# Patient Record
Sex: Female | Born: 1950 | Race: White | Hispanic: No | Marital: Single | Smoking: Never smoker
Health system: Southern US, Community
[De-identification: ages and names within clinical notes are randomized; demographics above are authoritative.]

## PROBLEM LIST (undated history)

## (undated) DIAGNOSIS — Z9221 Personal history of antineoplastic chemotherapy: Secondary | ICD-10-CM

## (undated) DIAGNOSIS — F329 Major depressive disorder, single episode, unspecified: Secondary | ICD-10-CM

## (undated) DIAGNOSIS — I10 Essential (primary) hypertension: Secondary | ICD-10-CM

## (undated) DIAGNOSIS — F32A Depression, unspecified: Secondary | ICD-10-CM

## (undated) DIAGNOSIS — Z860100 Personal history of colon polyps, unspecified: Secondary | ICD-10-CM

## (undated) DIAGNOSIS — Z923 Personal history of irradiation: Secondary | ICD-10-CM

## (undated) DIAGNOSIS — H269 Unspecified cataract: Secondary | ICD-10-CM

## (undated) DIAGNOSIS — C50919 Malignant neoplasm of unspecified site of unspecified female breast: Secondary | ICD-10-CM

## (undated) DIAGNOSIS — E039 Hypothyroidism, unspecified: Secondary | ICD-10-CM

## (undated) DIAGNOSIS — M1990 Unspecified osteoarthritis, unspecified site: Secondary | ICD-10-CM

## (undated) DIAGNOSIS — E119 Type 2 diabetes mellitus without complications: Secondary | ICD-10-CM

## (undated) DIAGNOSIS — N938 Other specified abnormal uterine and vaginal bleeding: Secondary | ICD-10-CM

## (undated) HISTORY — DX: Unspecified cataract: H26.9

## (undated) HISTORY — PX: SKIN BIOPSY: SHX1

## (undated) HISTORY — DX: Major depressive disorder, single episode, unspecified: F32.9

## (undated) HISTORY — DX: Malignant neoplasm of unspecified site of unspecified female breast: C50.919

## (undated) HISTORY — DX: Type 2 diabetes mellitus without complications: E11.9

## (undated) HISTORY — PX: ENDOMETRIAL BIOPSY: SHX622

## (undated) HISTORY — DX: Depression, unspecified: F32.A

## (undated) HISTORY — PX: COLONOSCOPY: SHX174

## (undated) HISTORY — DX: Hypothyroidism, unspecified: E03.9

## (undated) HISTORY — DX: Unspecified osteoarthritis, unspecified site: M19.90

---

## 2004-03-21 ENCOUNTER — Ambulatory Visit: Payer: Self-pay | Admitting: Family Medicine

## 2004-07-05 ENCOUNTER — Ambulatory Visit: Payer: Self-pay | Admitting: Family Medicine

## 2004-07-20 ENCOUNTER — Ambulatory Visit: Payer: Self-pay | Admitting: Family Medicine

## 2005-06-26 ENCOUNTER — Ambulatory Visit: Payer: Self-pay | Admitting: Family Medicine

## 2007-02-19 ENCOUNTER — Ambulatory Visit: Payer: Self-pay | Admitting: General Practice

## 2007-06-30 ENCOUNTER — Ambulatory Visit: Payer: Self-pay | Admitting: Family Medicine

## 2007-07-09 DIAGNOSIS — E039 Hypothyroidism, unspecified: Secondary | ICD-10-CM

## 2007-07-09 HISTORY — DX: Hypothyroidism, unspecified: E03.9

## 2007-08-26 ENCOUNTER — Ambulatory Visit: Payer: Self-pay | Admitting: Unknown Physician Specialty

## 2008-07-07 ENCOUNTER — Ambulatory Visit: Payer: Self-pay | Admitting: Family Medicine

## 2010-01-09 ENCOUNTER — Ambulatory Visit: Payer: Self-pay | Admitting: Family Medicine

## 2011-01-14 ENCOUNTER — Ambulatory Visit: Payer: Self-pay | Admitting: Family Medicine

## 2012-03-04 ENCOUNTER — Ambulatory Visit: Payer: Self-pay | Admitting: Family Medicine

## 2013-01-14 DIAGNOSIS — Z923 Personal history of irradiation: Secondary | ICD-10-CM

## 2013-01-14 DIAGNOSIS — C50919 Malignant neoplasm of unspecified site of unspecified female breast: Secondary | ICD-10-CM

## 2013-01-14 DIAGNOSIS — Z9221 Personal history of antineoplastic chemotherapy: Secondary | ICD-10-CM

## 2013-01-14 HISTORY — DX: Malignant neoplasm of unspecified site of unspecified female breast: C50.919

## 2013-01-14 HISTORY — DX: Personal history of irradiation: Z92.3

## 2013-01-14 HISTORY — DX: Personal history of antineoplastic chemotherapy: Z92.21

## 2013-01-26 ENCOUNTER — Ambulatory Visit: Payer: Self-pay | Admitting: Ophthalmology

## 2013-01-26 LAB — POTASSIUM: POTASSIUM: 3.7 mmol/L (ref 3.5–5.1)

## 2013-02-08 ENCOUNTER — Ambulatory Visit: Payer: Self-pay | Admitting: Ophthalmology

## 2013-02-08 HISTORY — PX: CATARACT EXTRACTION: SUR2

## 2013-03-14 HISTORY — PX: BREAST BIOPSY: SHX20

## 2013-04-07 ENCOUNTER — Ambulatory Visit: Payer: Self-pay | Admitting: Family Medicine

## 2013-04-13 ENCOUNTER — Ambulatory Visit: Payer: Self-pay | Admitting: Family Medicine

## 2013-04-19 ENCOUNTER — Ambulatory Visit: Payer: Self-pay | Admitting: Oncology

## 2013-04-21 LAB — CBC CANCER CENTER
Basophil #: 0.1 x10 3/mm (ref 0.0–0.1)
Basophil %: 1.1 %
EOS ABS: 0.1 x10 3/mm (ref 0.0–0.7)
Eosinophil %: 1.6 %
HCT: 38.9 % (ref 35.0–47.0)
HGB: 12.5 g/dL (ref 12.0–16.0)
Lymphocyte #: 1.7 x10 3/mm (ref 1.0–3.6)
Lymphocyte %: 18.8 %
MCH: 26.9 pg (ref 26.0–34.0)
MCHC: 32.2 g/dL (ref 32.0–36.0)
MCV: 84 fL (ref 80–100)
MONO ABS: 0.4 x10 3/mm (ref 0.2–0.9)
MONOS PCT: 4.7 %
NEUTROS ABS: 6.7 x10 3/mm — AB (ref 1.4–6.5)
NEUTROS PCT: 73.8 %
PLATELETS: 295 x10 3/mm (ref 150–440)
RBC: 4.65 10*6/uL (ref 3.80–5.20)
RDW: 14.8 % — ABNORMAL HIGH (ref 11.5–14.5)
WBC: 9 x10 3/mm (ref 3.6–11.0)

## 2013-04-21 LAB — COMPREHENSIVE METABOLIC PANEL WITH GFR
Albumin: 3.7 g/dL
Alkaline Phosphatase: 98 U/L
Anion Gap: 7
BUN: 17 mg/dL
Bilirubin,Total: 0.3 mg/dL
Calcium, Total: 8.9 mg/dL
Chloride: 101 mmol/L
Co2: 29 mmol/L
Creatinine: 1 mg/dL
EGFR (African American): >60
EGFR (Non-African Amer.): >60
Glucose: 266 mg/dL — ABNORMAL HIGH
Osmolality: 285
Potassium: 4.1 mmol/L
SGOT(AST): 20 U/L
SGPT (ALT): 30 U/L
Sodium: 137 mmol/L
Total Protein: 7.8 g/dL

## 2013-04-22 LAB — PATHOLOGY REPORT

## 2013-05-04 ENCOUNTER — Ambulatory Visit: Payer: Self-pay | Admitting: Surgery

## 2013-05-07 ENCOUNTER — Ambulatory Visit: Payer: Self-pay | Admitting: Oncology

## 2013-05-07 ENCOUNTER — Ambulatory Visit: Payer: Self-pay | Admitting: Surgery

## 2013-05-10 LAB — PATHOLOGY REPORT

## 2013-05-14 ENCOUNTER — Ambulatory Visit: Payer: Self-pay | Admitting: Oncology

## 2013-05-17 LAB — CBC CANCER CENTER
Basophil #: 0.2 x10 3/mm — ABNORMAL HIGH (ref 0.0–0.1)
Basophil %: 0.6 %
EOS ABS: 0.1 x10 3/mm (ref 0.0–0.7)
EOS PCT: 0.4 %
HCT: 37.3 % (ref 35.0–47.0)
HGB: 12.4 g/dL (ref 12.0–16.0)
LYMPHS PCT: 6.7 %
Lymphocyte #: 1.7 x10 3/mm (ref 1.0–3.6)
MCH: 27.3 pg (ref 26.0–34.0)
MCHC: 33.3 g/dL (ref 32.0–36.0)
MCV: 82 fL (ref 80–100)
Monocyte #: 1.1 x10 3/mm — ABNORMAL HIGH (ref 0.2–0.9)
Monocyte %: 4.2 %
NEUTROS ABS: 22.3 x10 3/mm — AB (ref 1.4–6.5)
Neutrophil %: 88.1 %
PLATELETS: 255 x10 3/mm (ref 150–440)
RBC: 4.54 10*6/uL (ref 3.80–5.20)
RDW: 14.3 % (ref 11.5–14.5)
WBC: 25.4 x10 3/mm — AB (ref 3.6–11.0)

## 2013-05-24 LAB — CBC CANCER CENTER
BASOS ABS: 0.1 x10 3/mm (ref 0.0–0.1)
BASOS PCT: 0.8 %
EOS ABS: 0 x10 3/mm (ref 0.0–0.7)
Eosinophil %: 0 %
HCT: 34.7 % — ABNORMAL LOW (ref 35.0–47.0)
HGB: 11.4 g/dL — AB (ref 12.0–16.0)
LYMPHS PCT: 10.9 %
Lymphocyte #: 1.9 x10 3/mm (ref 1.0–3.6)
MCH: 29.1 pg (ref 26.0–34.0)
MCHC: 32.9 g/dL (ref 32.0–36.0)
MCV: 88 fL (ref 80–100)
MONOS PCT: 4.4 %
Monocyte #: 0.8 x10 3/mm (ref 0.2–0.9)
NEUTROS PCT: 83.9 %
Neutrophil #: 14.3 x10 3/mm — ABNORMAL HIGH (ref 1.4–6.5)
PLATELETS: 214 x10 3/mm (ref 150–440)
RBC: 3.92 10*6/uL (ref 3.80–5.20)
RDW: 14.3 % (ref 11.5–14.5)
WBC: 17.1 x10 3/mm — ABNORMAL HIGH (ref 3.6–11.0)

## 2013-05-31 LAB — COMPREHENSIVE METABOLIC PANEL
ALT: 30 U/L (ref 12–78)
ANION GAP: 12 (ref 7–16)
Albumin: 3.3 g/dL — ABNORMAL LOW (ref 3.4–5.0)
Alkaline Phosphatase: 99 U/L
BUN: 13 mg/dL (ref 7–18)
Bilirubin,Total: 0.3 mg/dL (ref 0.2–1.0)
CO2: 30 mmol/L (ref 21–32)
CREATININE: 0.99 mg/dL (ref 0.60–1.30)
Calcium, Total: 8.6 mg/dL (ref 8.5–10.1)
Chloride: 99 mmol/L (ref 98–107)
Glucose: 167 mg/dL — ABNORMAL HIGH (ref 65–99)
Osmolality: 285 (ref 275–301)
Potassium: 3.6 mmol/L (ref 3.5–5.1)
SGOT(AST): 15 U/L (ref 15–37)
Sodium: 141 mmol/L (ref 136–145)
Total Protein: 7.1 g/dL (ref 6.4–8.2)

## 2013-05-31 LAB — CBC CANCER CENTER
Basophil #: 0.1 x10 3/mm (ref 0.0–0.1)
Basophil %: 1.1 %
EOS ABS: 0.1 x10 3/mm (ref 0.0–0.7)
Eosinophil %: 0.5 %
HCT: 31.4 % — ABNORMAL LOW (ref 35.0–47.0)
HGB: 10.7 g/dL — AB (ref 12.0–16.0)
LYMPHS ABS: 1.5 x10 3/mm (ref 1.0–3.6)
Lymphocyte %: 12.8 %
MCH: 30.9 pg (ref 26.0–34.0)
MCHC: 34.3 g/dL (ref 32.0–36.0)
MCV: 90 fL (ref 80–100)
Monocyte #: 0.7 x10 3/mm (ref 0.2–0.9)
Monocyte %: 6.3 %
Neutrophil #: 9.1 x10 3/mm — ABNORMAL HIGH (ref 1.4–6.5)
Neutrophil %: 79.3 %
PLATELETS: 482 x10 3/mm — AB (ref 150–440)
RBC: 3.48 10*6/uL — AB (ref 3.80–5.20)
RDW: 14.4 % (ref 11.5–14.5)
WBC: 11.5 x10 3/mm — AB (ref 3.6–11.0)

## 2013-06-08 LAB — CBC CANCER CENTER
Basophil #: 0.1 x10 3/mm (ref 0.0–0.1)
Basophil %: 0.5 %
EOS ABS: 0.2 x10 3/mm (ref 0.0–0.7)
Eosinophil %: 1 %
HCT: 33.6 % — ABNORMAL LOW (ref 35.0–47.0)
HGB: 10.9 g/dL — ABNORMAL LOW (ref 12.0–16.0)
Lymphocyte #: 1.8 x10 3/mm (ref 1.0–3.6)
Lymphocyte %: 9.1 %
MCH: 27.6 pg (ref 26.0–34.0)
MCHC: 32.4 g/dL (ref 32.0–36.0)
MCV: 86 fL (ref 80–100)
MONO ABS: 1 x10 3/mm — AB (ref 0.2–0.9)
MONOS PCT: 4.9 %
NEUTROS PCT: 84.5 %
Neutrophil #: 17 x10 3/mm — ABNORMAL HIGH (ref 1.4–6.5)
Platelet: 215 x10 3/mm (ref 150–440)
RBC: 3.93 10*6/uL (ref 3.80–5.20)
RDW: 15 % — ABNORMAL HIGH (ref 11.5–14.5)
WBC: 20.1 x10 3/mm — ABNORMAL HIGH (ref 3.6–11.0)

## 2013-06-14 ENCOUNTER — Ambulatory Visit: Payer: Self-pay | Admitting: Oncology

## 2013-06-14 LAB — CBC CANCER CENTER
BASOS PCT: 0.4 %
Basophil #: 0.1 x10 3/mm (ref 0.0–0.1)
EOS ABS: 0 x10 3/mm (ref 0.0–0.7)
Eosinophil %: 0.1 %
HCT: 34 % — AB (ref 35.0–47.0)
HGB: 11.1 g/dL — ABNORMAL LOW (ref 12.0–16.0)
LYMPHS ABS: 1.5 x10 3/mm (ref 1.0–3.6)
Lymphocyte %: 9.2 %
MCH: 28.4 pg (ref 26.0–34.0)
MCHC: 32.7 g/dL (ref 32.0–36.0)
MCV: 87 fL (ref 80–100)
Monocyte #: 0.6 x10 3/mm (ref 0.2–0.9)
Monocyte %: 3.5 %
NEUTROS ABS: 14.3 x10 3/mm — AB (ref 1.4–6.5)
Neutrophil %: 86.8 %
Platelet: 208 x10 3/mm (ref 150–440)
RBC: 3.93 10*6/uL (ref 3.80–5.20)
RDW: 15.5 % — ABNORMAL HIGH (ref 11.5–14.5)
WBC: 16.5 x10 3/mm — ABNORMAL HIGH (ref 3.6–11.0)

## 2013-06-21 LAB — CBC CANCER CENTER
BASOS ABS: 0.1 x10 3/mm (ref 0.0–0.1)
Basophil %: 0.8 %
EOS PCT: 0.4 %
Eosinophil #: 0.1 x10 3/mm (ref 0.0–0.7)
HCT: 32 % — AB (ref 35.0–47.0)
HGB: 10.5 g/dL — ABNORMAL LOW (ref 12.0–16.0)
Lymphocyte #: 2 x10 3/mm (ref 1.0–3.6)
Lymphocyte %: 16.1 %
MCH: 28.5 pg (ref 26.0–34.0)
MCHC: 32.9 g/dL (ref 32.0–36.0)
MCV: 87 fL (ref 80–100)
MONO ABS: 0.7 x10 3/mm (ref 0.2–0.9)
Monocyte %: 5.9 %
Neutrophil #: 9.5 x10 3/mm — ABNORMAL HIGH (ref 1.4–6.5)
Neutrophil %: 76.8 %
PLATELETS: 457 x10 3/mm — AB (ref 150–440)
RBC: 3.69 10*6/uL — ABNORMAL LOW (ref 3.80–5.20)
RDW: 17.4 % — AB (ref 11.5–14.5)
WBC: 12.3 x10 3/mm — ABNORMAL HIGH (ref 3.6–11.0)

## 2013-06-21 LAB — COMPREHENSIVE METABOLIC PANEL
AST: 18 U/L (ref 15–37)
Albumin: 3.4 g/dL (ref 3.4–5.0)
Alkaline Phosphatase: 111 U/L
Anion Gap: 10 (ref 7–16)
BILIRUBIN TOTAL: 0.3 mg/dL (ref 0.2–1.0)
BUN: 16 mg/dL (ref 7–18)
CALCIUM: 8.8 mg/dL (ref 8.5–10.1)
Chloride: 98 mmol/L (ref 98–107)
Co2: 29 mmol/L (ref 21–32)
Creatinine: 1.06 mg/dL (ref 0.60–1.30)
EGFR (African American): >60
GFR CALC NON AF AMER: 56 — AB
Glucose: 216 mg/dL — ABNORMAL HIGH (ref 65–99)
OSMOLALITY: 282 (ref 275–301)
Potassium: 3.2 mmol/L — ABNORMAL LOW (ref 3.5–5.1)
SGPT (ALT): 29 U/L (ref 12–78)
Sodium: 137 mmol/L (ref 136–145)
Total Protein: 7.3 g/dL (ref 6.4–8.2)

## 2013-06-28 LAB — CBC CANCER CENTER
BASOS ABS: 0.1 x10 3/mm (ref 0.0–0.1)
Basophil %: 1 %
Eosinophil #: 0 x10 3/mm (ref 0.0–0.7)
Eosinophil %: 0.3 %
HCT: 32.8 % — ABNORMAL LOW (ref 35.0–47.0)
HGB: 11 g/dL — AB (ref 12.0–16.0)
Lymphocyte #: 1.6 x10 3/mm (ref 1.0–3.6)
Lymphocyte %: 12.9 %
MCH: 29.1 pg (ref 26.0–34.0)
MCHC: 33.5 g/dL (ref 32.0–36.0)
MCV: 87 fL (ref 80–100)
MONOS PCT: 9.3 %
Monocyte #: 1.2 x10 3/mm — ABNORMAL HIGH (ref 0.2–0.9)
NEUTROS ABS: 9.6 x10 3/mm — AB (ref 1.4–6.5)
Neutrophil %: 76.5 %
Platelet: 312 x10 3/mm (ref 150–440)
RBC: 3.78 10*6/uL — AB (ref 3.80–5.20)
RDW: 16.9 % — ABNORMAL HIGH (ref 11.5–14.5)
WBC: 12.6 x10 3/mm — ABNORMAL HIGH (ref 3.6–11.0)

## 2013-07-05 LAB — CBC CANCER CENTER
BASOS PCT: 0.5 %
Basophil #: 0.1 x10 3/mm (ref 0.0–0.1)
Eosinophil #: 0 x10 3/mm (ref 0.0–0.7)
Eosinophil %: 0.1 %
HCT: 31.1 % — AB (ref 35.0–47.0)
HGB: 10.7 g/dL — AB (ref 12.0–16.0)
LYMPHS PCT: 9.7 %
Lymphocyte #: 1.8 x10 3/mm (ref 1.0–3.6)
MCH: 36.5 pg — AB (ref 26.0–34.0)
MCHC: 34.5 g/dL (ref 32.0–36.0)
MCV: 106 fL — AB (ref 80–100)
MONO ABS: 0.8 x10 3/mm (ref 0.2–0.9)
MONOS PCT: 4.3 %
NEUTROS ABS: 15.8 x10 3/mm — AB (ref 1.4–6.5)
Neutrophil %: 85.4 %
Platelet: 197 x10 3/mm (ref 150–440)
RBC: 2.94 10*6/uL — AB (ref 3.80–5.20)
RDW: 16.8 % — ABNORMAL HIGH (ref 11.5–14.5)
WBC: 18.5 x10 3/mm — AB (ref 3.6–11.0)

## 2013-07-12 LAB — COMPREHENSIVE METABOLIC PANEL
ALBUMIN: 3.3 g/dL — AB (ref 3.4–5.0)
ALK PHOS: 108 U/L
Anion Gap: 4 — ABNORMAL LOW (ref 7–16)
BILIRUBIN TOTAL: 0.3 mg/dL (ref 0.2–1.0)
BUN: 13 mg/dL (ref 7–18)
CHLORIDE: 101 mmol/L (ref 98–107)
Calcium, Total: 8.4 mg/dL — ABNORMAL LOW (ref 8.5–10.1)
Co2: 31 mmol/L (ref 21–32)
Creatinine: 1.16 mg/dL (ref 0.60–1.30)
EGFR (African American): 58 — ABNORMAL LOW
EGFR (Non-African Amer.): 50 — ABNORMAL LOW
Glucose: 223 mg/dL — ABNORMAL HIGH (ref 65–99)
Osmolality: 279 (ref 275–301)
Potassium: 3 mmol/L — ABNORMAL LOW (ref 3.5–5.1)
SGOT(AST): 23 U/L (ref 15–37)
SGPT (ALT): 30 U/L (ref 12–78)
Sodium: 136 mmol/L (ref 136–145)
TOTAL PROTEIN: 7.2 g/dL (ref 6.4–8.2)

## 2013-07-12 LAB — CBC CANCER CENTER
BASOS ABS: 0.1 x10 3/mm (ref 0.0–0.1)
Basophil %: 0.9 %
Eosinophil #: 0 x10 3/mm (ref 0.0–0.7)
Eosinophil %: 0.1 %
HCT: 29.6 % — ABNORMAL LOW (ref 35.0–47.0)
HGB: 9.8 g/dL — ABNORMAL LOW (ref 12.0–16.0)
LYMPHS PCT: 14 %
Lymphocyte #: 1.6 x10 3/mm (ref 1.0–3.6)
MCH: 29.7 pg (ref 26.0–34.0)
MCHC: 33.1 g/dL (ref 32.0–36.0)
MCV: 90 fL (ref 80–100)
Monocyte #: 0.7 x10 3/mm (ref 0.2–0.9)
Monocyte %: 5.6 %
NEUTROS ABS: 9.4 x10 3/mm — AB (ref 1.4–6.5)
NEUTROS PCT: 79.4 %
Platelet: 326 x10 3/mm (ref 150–440)
RBC: 3.3 10*6/uL — ABNORMAL LOW (ref 3.80–5.20)
RDW: 19.8 % — ABNORMAL HIGH (ref 11.5–14.5)
WBC: 11.8 x10 3/mm — AB (ref 3.6–11.0)

## 2013-07-14 ENCOUNTER — Ambulatory Visit: Payer: Self-pay | Admitting: Oncology

## 2013-07-19 LAB — CBC CANCER CENTER
Basophil #: 0.1 x10 3/mm (ref 0.0–0.1)
Basophil %: 1 %
EOS PCT: 0.3 %
Eosinophil #: 0 x10 3/mm (ref 0.0–0.7)
HCT: 28.8 % — AB (ref 35.0–47.0)
HGB: 9.9 g/dL — ABNORMAL LOW (ref 12.0–16.0)
LYMPHS ABS: 1.8 x10 3/mm (ref 1.0–3.6)
Lymphocyte %: 18.8 %
MCH: 34.7 pg — AB (ref 26.0–34.0)
MCHC: 34.4 g/dL (ref 32.0–36.0)
MCV: 101 fL — AB (ref 80–100)
MONOS PCT: 9.9 %
Monocyte #: 1 x10 3/mm — ABNORMAL HIGH (ref 0.2–0.9)
Neutrophil #: 6.8 x10 3/mm — ABNORMAL HIGH (ref 1.4–6.5)
Neutrophil %: 70 %
Platelet: 190 x10 3/mm (ref 150–440)
RBC: 2.85 10*6/uL — AB (ref 3.80–5.20)
RDW: 17.4 % — AB (ref 11.5–14.5)
WBC: 9.7 x10 3/mm (ref 3.6–11.0)

## 2013-07-29 LAB — CBC CANCER CENTER
BASOS PCT: 0.7 %
Basophil #: 0.1 x10 3/mm (ref 0.0–0.1)
EOS ABS: 0 x10 3/mm (ref 0.0–0.7)
Eosinophil %: 0.1 %
HCT: 29.9 % — AB (ref 35.0–47.0)
HGB: 10 g/dL — AB (ref 12.0–16.0)
LYMPHS ABS: 1.5 x10 3/mm (ref 1.0–3.6)
LYMPHS PCT: 15.7 %
MCH: 31.4 pg (ref 26.0–34.0)
MCHC: 33.6 g/dL (ref 32.0–36.0)
MCV: 93 fL (ref 80–100)
MONOS PCT: 5.9 %
Monocyte #: 0.6 x10 3/mm (ref 0.2–0.9)
Neutrophil #: 7.6 x10 3/mm — ABNORMAL HIGH (ref 1.4–6.5)
Neutrophil %: 77.6 %
Platelet: 268 x10 3/mm (ref 150–440)
RBC: 3.2 10*6/uL — ABNORMAL LOW (ref 3.80–5.20)
RDW: 20.2 % — ABNORMAL HIGH (ref 11.5–14.5)
WBC: 9.8 x10 3/mm (ref 3.6–11.0)

## 2013-08-02 LAB — COMPREHENSIVE METABOLIC PANEL
ALBUMIN: 3.5 g/dL (ref 3.4–5.0)
ALT: 37 U/L (ref 12–78)
Alkaline Phosphatase: 101 U/L
Anion Gap: 9 (ref 7–16)
BUN: 16 mg/dL (ref 7–18)
Bilirubin,Total: 0.3 mg/dL (ref 0.2–1.0)
Calcium, Total: 9.2 mg/dL (ref 8.5–10.1)
Chloride: 101 mmol/L (ref 98–107)
Co2: 29 mmol/L (ref 21–32)
Creatinine: 1.16 mg/dL (ref 0.60–1.30)
EGFR (African American): 58 — ABNORMAL LOW
GFR CALC NON AF AMER: 50 — AB
GLUCOSE: 208 mg/dL — AB (ref 65–99)
OSMOLALITY: 285 (ref 275–301)
POTASSIUM: 2.9 mmol/L — AB (ref 3.5–5.1)
SGOT(AST): 24 U/L (ref 15–37)
SODIUM: 139 mmol/L (ref 136–145)
TOTAL PROTEIN: 7.1 g/dL (ref 6.4–8.2)

## 2013-08-02 LAB — CBC CANCER CENTER
BASOS ABS: 0.1 x10 3/mm (ref 0.0–0.1)
Basophil %: 1.2 %
Eosinophil #: 0 x10 3/mm (ref 0.0–0.7)
Eosinophil %: 0.2 %
HCT: 31.5 % — ABNORMAL LOW (ref 35.0–47.0)
HGB: 10.5 g/dL — ABNORMAL LOW (ref 12.0–16.0)
Lymphocyte #: 2.1 x10 3/mm (ref 1.0–3.6)
Lymphocyte %: 19.4 %
MCH: 31.6 pg (ref 26.0–34.0)
MCHC: 33.3 g/dL (ref 32.0–36.0)
MCV: 95 fL (ref 80–100)
MONO ABS: 0.8 x10 3/mm (ref 0.2–0.9)
MONOS PCT: 6.9 %
Neutrophil #: 8 x10 3/mm — ABNORMAL HIGH (ref 1.4–6.5)
Neutrophil %: 72.3 %
Platelet: 301 x10 3/mm (ref 150–440)
RBC: 3.33 10*6/uL — ABNORMAL LOW (ref 3.80–5.20)
RDW: 20.1 % — ABNORMAL HIGH (ref 11.5–14.5)
WBC: 11 x10 3/mm (ref 3.6–11.0)

## 2013-08-09 LAB — CBC CANCER CENTER
BASOS ABS: 0.1 x10 3/mm (ref 0.0–0.1)
BASOS PCT: 0.9 %
EOS ABS: 0 x10 3/mm (ref 0.0–0.7)
Eosinophil %: 0.2 %
HCT: 31.5 % — ABNORMAL LOW (ref 35.0–47.0)
HGB: 10.5 g/dL — ABNORMAL LOW (ref 12.0–16.0)
Lymphocyte #: 1.3 x10 3/mm (ref 1.0–3.6)
Lymphocyte %: 20.5 %
MCH: 32.1 pg (ref 26.0–34.0)
MCHC: 33.2 g/dL (ref 32.0–36.0)
MCV: 97 fL (ref 80–100)
MONO ABS: 0.6 x10 3/mm (ref 0.2–0.9)
Monocyte %: 9.7 %
NEUTROS ABS: 4.2 x10 3/mm (ref 1.4–6.5)
NEUTROS PCT: 68.7 %
Platelet: 181 x10 3/mm (ref 150–440)
RBC: 3.26 10*6/uL — ABNORMAL LOW (ref 3.80–5.20)
RDW: 17.6 % — ABNORMAL HIGH (ref 11.5–14.5)
WBC: 6.2 x10 3/mm (ref 3.6–11.0)

## 2013-08-14 ENCOUNTER — Ambulatory Visit: Payer: Self-pay | Admitting: Oncology

## 2013-08-16 LAB — CBC CANCER CENTER
BASOS ABS: 0 x10 3/mm (ref 0.0–0.1)
Basophil %: 0.3 %
Eosinophil #: 0 x10 3/mm (ref 0.0–0.7)
Eosinophil %: 0.1 %
HCT: 29.7 % — ABNORMAL LOW (ref 35.0–47.0)
HGB: 10.1 g/dL — ABNORMAL LOW (ref 12.0–16.0)
Lymphocyte #: 1.4 x10 3/mm (ref 1.0–3.6)
Lymphocyte %: 9.4 %
MCH: 35.1 pg — ABNORMAL HIGH (ref 26.0–34.0)
MCHC: 33.8 g/dL (ref 32.0–36.0)
MCV: 104 fL — AB (ref 80–100)
MONO ABS: 0.6 x10 3/mm (ref 0.2–0.9)
Monocyte %: 4.3 %
NEUTROS ABS: 12.5 x10 3/mm — AB (ref 1.4–6.5)
Neutrophil %: 85.9 %
Platelet: 245 x10 3/mm (ref 150–440)
RBC: 2.87 10*6/uL — AB (ref 3.80–5.20)
RDW: 18 % — AB (ref 11.5–14.5)
WBC: 14.5 x10 3/mm — ABNORMAL HIGH (ref 3.6–11.0)

## 2013-08-23 LAB — CBC CANCER CENTER
Basophil #: 0 x10 3/mm (ref 0.0–0.1)
Basophil %: 0.3 %
EOS ABS: 0 x10 3/mm (ref 0.0–0.7)
Eosinophil %: 0.1 %
HCT: 30.2 % — ABNORMAL LOW (ref 35.0–47.0)
HGB: 10.2 g/dL — AB (ref 12.0–16.0)
LYMPHS PCT: 14.9 %
Lymphocyte #: 1.8 x10 3/mm (ref 1.0–3.6)
MCH: 32.6 pg (ref 26.0–34.0)
MCHC: 33.8 g/dL (ref 32.0–36.0)
MCV: 97 fL (ref 80–100)
MONOS PCT: 6.3 %
Monocyte #: 0.7 x10 3/mm (ref 0.2–0.9)
Neutrophil #: 9.3 x10 3/mm — ABNORMAL HIGH (ref 1.4–6.5)
Neutrophil %: 78.4 %
PLATELETS: 324 x10 3/mm (ref 150–440)
RBC: 3.13 10*6/uL — ABNORMAL LOW (ref 3.80–5.20)
RDW: 17.4 % — ABNORMAL HIGH (ref 11.5–14.5)
WBC: 11.9 x10 3/mm — AB (ref 3.6–11.0)

## 2013-08-23 LAB — COMPREHENSIVE METABOLIC PANEL
ALK PHOS: 107 U/L
ALT: 29 U/L
ANION GAP: 10 (ref 7–16)
Albumin: 3.3 g/dL — ABNORMAL LOW (ref 3.4–5.0)
BUN: 19 mg/dL — ABNORMAL HIGH (ref 7–18)
Bilirubin,Total: 0.3 mg/dL (ref 0.2–1.0)
CHLORIDE: 99 mmol/L (ref 98–107)
Calcium, Total: 8.4 mg/dL — ABNORMAL LOW (ref 8.5–10.1)
Co2: 27 mmol/L (ref 21–32)
Creatinine: 1.1 mg/dL (ref 0.60–1.30)
EGFR (African American): >60
GFR CALC NON AF AMER: 54 — AB
GLUCOSE: 201 mg/dL — AB (ref 65–99)
OSMOLALITY: 280 (ref 275–301)
Potassium: 3.4 mmol/L — ABNORMAL LOW (ref 3.5–5.1)
SGOT(AST): 20 U/L (ref 15–37)
SODIUM: 136 mmol/L (ref 136–145)
Total Protein: 7 g/dL (ref 6.4–8.2)

## 2013-08-30 LAB — CBC CANCER CENTER
BASOS PCT: 0.9 %
Basophil #: 0.1 x10 3/mm (ref 0.0–0.1)
EOS PCT: 0.2 %
Eosinophil #: 0 x10 3/mm (ref 0.0–0.7)
HCT: 28.4 % — AB (ref 35.0–47.0)
HGB: 9.7 g/dL — AB (ref 12.0–16.0)
LYMPHS ABS: 1.7 x10 3/mm (ref 1.0–3.6)
LYMPHS PCT: 26 %
MCH: 35.4 pg — ABNORMAL HIGH (ref 26.0–34.0)
MCHC: 34.1 g/dL (ref 32.0–36.0)
MCV: 104 fL — AB (ref 80–100)
Monocyte #: 0.8 x10 3/mm (ref 0.2–0.9)
Monocyte %: 11.9 %
Neutrophil #: 4 x10 3/mm (ref 1.4–6.5)
Neutrophil %: 61 %
Platelet: 226 x10 3/mm (ref 150–440)
RBC: 2.74 10*6/uL — ABNORMAL LOW (ref 3.80–5.20)
RDW: 15.7 % — ABNORMAL HIGH (ref 11.5–14.5)
WBC: 6.6 x10 3/mm (ref 3.6–11.0)

## 2013-09-06 LAB — CBC CANCER CENTER
BASOS PCT: 0.3 %
Basophil #: 0 x10 3/mm (ref 0.0–0.1)
EOS ABS: 0 x10 3/mm (ref 0.0–0.7)
Eosinophil %: 0.1 %
HCT: 31.1 % — AB (ref 35.0–47.0)
HGB: 10.3 g/dL — ABNORMAL LOW (ref 12.0–16.0)
Lymphocyte #: 1.3 x10 3/mm (ref 1.0–3.6)
Lymphocyte %: 9.1 %
MCH: 32.6 pg (ref 26.0–34.0)
MCHC: 33.3 g/dL (ref 32.0–36.0)
MCV: 98 fL (ref 80–100)
MONO ABS: 0.6 x10 3/mm (ref 0.2–0.9)
Monocyte %: 4.4 %
Neutrophil #: 12.1 x10 3/mm — ABNORMAL HIGH (ref 1.4–6.5)
Neutrophil %: 86.1 %
PLATELETS: 236 x10 3/mm (ref 150–440)
RBC: 3.17 10*6/uL — ABNORMAL LOW (ref 3.80–5.20)
RDW: 15.8 % — ABNORMAL HIGH (ref 11.5–14.5)
WBC: 14 x10 3/mm — ABNORMAL HIGH (ref 3.6–11.0)

## 2013-09-13 LAB — COMPREHENSIVE METABOLIC PANEL
AST: 20 U/L (ref 15–37)
Albumin: 3.3 g/dL — ABNORMAL LOW (ref 3.4–5.0)
Alkaline Phosphatase: 91 U/L
Anion Gap: 8 (ref 7–16)
BUN: 11 mg/dL (ref 7–18)
Bilirubin,Total: 0.2 mg/dL (ref 0.2–1.0)
CHLORIDE: 104 mmol/L (ref 98–107)
CO2: 29 mmol/L (ref 21–32)
CREATININE: 1.05 mg/dL (ref 0.60–1.30)
Calcium, Total: 8 mg/dL — ABNORMAL LOW (ref 8.5–10.1)
EGFR (African American): >60
EGFR (Non-African Amer.): 57 — ABNORMAL LOW
GLUCOSE: 226 mg/dL — AB (ref 65–99)
Osmolality: 288 (ref 275–301)
Potassium: 2.8 mmol/L — ABNORMAL LOW (ref 3.5–5.1)
SGPT (ALT): 30 U/L
SODIUM: 141 mmol/L (ref 136–145)
TOTAL PROTEIN: 6.9 g/dL (ref 6.4–8.2)

## 2013-09-13 LAB — CBC CANCER CENTER
BASOS ABS: 0 x10 3/mm (ref 0.0–0.1)
Basophil %: 0.4 %
EOS ABS: 0 x10 3/mm (ref 0.0–0.7)
Eosinophil %: 0.1 %
HCT: 30.1 % — AB (ref 35.0–47.0)
HGB: 9.9 g/dL — ABNORMAL LOW (ref 12.0–16.0)
Lymphocyte #: 1.4 x10 3/mm (ref 1.0–3.6)
Lymphocyte %: 15.6 %
MCH: 32.4 pg (ref 26.0–34.0)
MCHC: 33 g/dL (ref 32.0–36.0)
MCV: 98 fL (ref 80–100)
MONO ABS: 0.6 x10 3/mm (ref 0.2–0.9)
Monocyte %: 6.4 %
NEUTROS ABS: 7 x10 3/mm — AB (ref 1.4–6.5)
NEUTROS PCT: 77.5 %
Platelet: 282 x10 3/mm (ref 150–440)
RBC: 3.07 10*6/uL — ABNORMAL LOW (ref 3.80–5.20)
RDW: 15.6 % — ABNORMAL HIGH (ref 11.5–14.5)
WBC: 9 x10 3/mm (ref 3.6–11.0)

## 2013-09-14 ENCOUNTER — Ambulatory Visit: Payer: Self-pay | Admitting: Oncology

## 2013-09-28 ENCOUNTER — Ambulatory Visit: Payer: Self-pay | Admitting: Surgery

## 2013-10-04 LAB — COMPREHENSIVE METABOLIC PANEL
ALBUMIN: 3.3 g/dL — AB (ref 3.4–5.0)
ALT: 27 U/L
Alkaline Phosphatase: 105 U/L
Anion Gap: 7 (ref 7–16)
BILIRUBIN TOTAL: 0.3 mg/dL (ref 0.2–1.0)
BUN: 16 mg/dL (ref 7–18)
CALCIUM: 9 mg/dL (ref 8.5–10.1)
CREATININE: 1.37 mg/dL — AB (ref 0.60–1.30)
Chloride: 98 mmol/L (ref 98–107)
Co2: 30 mmol/L (ref 21–32)
EGFR (African American): 48 — ABNORMAL LOW
EGFR (Non-African Amer.): 41 — ABNORMAL LOW
Glucose: 242 mg/dL — ABNORMAL HIGH (ref 65–99)
Osmolality: 279 (ref 275–301)
Potassium: 3.5 mmol/L (ref 3.5–5.1)
SGOT(AST): 13 U/L — ABNORMAL LOW (ref 15–37)
Sodium: 135 mmol/L — ABNORMAL LOW (ref 136–145)
TOTAL PROTEIN: 7.5 g/dL (ref 6.4–8.2)

## 2013-10-04 LAB — CBC CANCER CENTER
BASOS ABS: 0 x10 3/mm (ref 0.0–0.1)
BASOS PCT: 0.4 %
EOS ABS: 0.1 x10 3/mm (ref 0.0–0.7)
EOS PCT: 1 %
HCT: 32.7 % — ABNORMAL LOW (ref 35.0–47.0)
HGB: 10.6 g/dL — AB (ref 12.0–16.0)
Lymphocyte #: 1.4 x10 3/mm (ref 1.0–3.6)
Lymphocyte %: 12 %
MCH: 31 pg (ref 26.0–34.0)
MCHC: 32.3 g/dL (ref 32.0–36.0)
MCV: 96 fL (ref 80–100)
MONO ABS: 0.7 x10 3/mm (ref 0.2–0.9)
Monocyte %: 5.7 %
NEUTROS ABS: 9.6 x10 3/mm — AB (ref 1.4–6.5)
Neutrophil %: 80.9 %
Platelet: 320 x10 3/mm (ref 150–440)
RBC: 3.41 10*6/uL — ABNORMAL LOW (ref 3.80–5.20)
RDW: 14 % (ref 11.5–14.5)
WBC: 11.8 x10 3/mm — ABNORMAL HIGH (ref 3.6–11.0)

## 2013-10-05 ENCOUNTER — Ambulatory Visit: Payer: Self-pay | Admitting: Surgery

## 2013-10-05 HISTORY — PX: MASTECTOMY MODIFIED RADICAL: SUR848

## 2013-10-05 LAB — CANCER ANTIGEN 27.29: CA 27.29: 22.9 U/mL (ref 0.0–38.6)

## 2013-10-12 LAB — PATHOLOGY REPORT

## 2013-10-14 ENCOUNTER — Ambulatory Visit: Payer: Self-pay | Admitting: Oncology

## 2013-10-25 LAB — CBC CANCER CENTER
Basophil #: 0 x10 3/mm (ref 0.0–0.1)
Basophil %: 0.6 %
Eosinophil #: 0.2 x10 3/mm (ref 0.0–0.7)
Eosinophil %: 3.1 %
HCT: 32.7 % — AB (ref 35.0–47.0)
HGB: 11.1 g/dL — AB (ref 12.0–16.0)
Lymphocyte #: 1.5 x10 3/mm (ref 1.0–3.6)
Lymphocyte %: 19.7 %
MCH: 36.4 pg — ABNORMAL HIGH (ref 26.0–34.0)
MCHC: 33.9 g/dL (ref 32.0–36.0)
MCV: 107 fL — ABNORMAL HIGH (ref 80–100)
Monocyte #: 0.4 x10 3/mm (ref 0.2–0.9)
Monocyte %: 5.2 %
Neutrophil #: 5.6 x10 3/mm (ref 1.4–6.5)
Neutrophil %: 71.4 %
Platelet: 354 x10 3/mm (ref 150–440)
RBC: 3.04 10*6/uL — AB (ref 3.80–5.20)
RDW: 14.3 % (ref 11.5–14.5)
WBC: 7.8 x10 3/mm (ref 3.6–11.0)

## 2013-10-25 LAB — BASIC METABOLIC PANEL
Anion Gap: 8 (ref 7–16)
BUN: 17 mg/dL (ref 7–18)
CALCIUM: 8.9 mg/dL (ref 8.5–10.1)
CHLORIDE: 101 mmol/L (ref 98–107)
Co2: 27 mmol/L (ref 21–32)
Creatinine: 1.15 mg/dL (ref 0.60–1.30)
EGFR (African American): >60
GFR CALC NON AF AMER: 51 — AB
Glucose: 229 mg/dL — ABNORMAL HIGH (ref 65–99)
OSMOLALITY: 281 (ref 275–301)
Potassium: 3.7 mmol/L (ref 3.5–5.1)
Sodium: 136 mmol/L (ref 136–145)

## 2013-10-25 LAB — MAGNESIUM: MAGNESIUM: 1.4 mg/dL — AB

## 2013-11-14 ENCOUNTER — Ambulatory Visit: Payer: Self-pay | Admitting: Oncology

## 2013-11-15 LAB — COMPREHENSIVE METABOLIC PANEL
ALBUMIN: 3.2 g/dL — AB (ref 3.4–5.0)
ANION GAP: 9 (ref 7–16)
Alkaline Phosphatase: 100 U/L
BILIRUBIN TOTAL: 0.2 mg/dL (ref 0.2–1.0)
BUN: 16 mg/dL (ref 7–18)
CALCIUM: 8.7 mg/dL (ref 8.5–10.1)
Chloride: 104 mmol/L (ref 98–107)
Co2: 27 mmol/L (ref 21–32)
Creatinine: 1.04 mg/dL (ref 0.60–1.30)
EGFR (African American): >60
EGFR (Non-African Amer.): 57 — ABNORMAL LOW
Glucose: 257 mg/dL — ABNORMAL HIGH (ref 65–99)
Osmolality: 289 (ref 275–301)
Potassium: 3.9 mmol/L (ref 3.5–5.1)
SGOT(AST): 15 U/L (ref 15–37)
SGPT (ALT): 25 U/L
Sodium: 140 mmol/L (ref 136–145)
TOTAL PROTEIN: 6.9 g/dL (ref 6.4–8.2)

## 2013-11-15 LAB — CBC CANCER CENTER
Basophil #: 0.1 x10 3/mm (ref 0.0–0.1)
Basophil %: 1 %
Eosinophil #: 0.2 x10 3/mm (ref 0.0–0.7)
Eosinophil %: 2.1 %
HCT: 33.9 % — ABNORMAL LOW (ref 35.0–47.0)
HGB: 11 g/dL — AB (ref 12.0–16.0)
LYMPHS ABS: 1.5 x10 3/mm (ref 1.0–3.6)
LYMPHS PCT: 17.7 %
MCH: 29.7 pg (ref 26.0–34.0)
MCHC: 32.5 g/dL (ref 32.0–36.0)
MCV: 91 fL (ref 80–100)
Monocyte #: 0.4 x10 3/mm (ref 0.2–0.9)
Monocyte %: 4.4 %
NEUTROS PCT: 74.8 %
Neutrophil #: 6.1 x10 3/mm (ref 1.4–6.5)
Platelet: 314 x10 3/mm (ref 150–440)
RBC: 3.71 10*6/uL — ABNORMAL LOW (ref 3.80–5.20)
RDW: 14.3 % (ref 11.5–14.5)
WBC: 8.2 x10 3/mm (ref 3.6–11.0)

## 2013-11-22 LAB — CBC CANCER CENTER
Basophil #: 0.1 x10 3/mm (ref 0.0–0.1)
Basophil %: 0.6 %
EOS PCT: 1.4 %
Eosinophil #: 0.2 x10 3/mm (ref 0.0–0.7)
HCT: 32.3 % — AB (ref 35.0–47.0)
HGB: 10.4 g/dL — AB (ref 12.0–16.0)
LYMPHS PCT: 7.3 %
Lymphocyte #: 0.9 x10 3/mm — ABNORMAL LOW (ref 1.0–3.6)
MCH: 28.8 pg (ref 26.0–34.0)
MCHC: 32.2 g/dL (ref 32.0–36.0)
MCV: 90 fL (ref 80–100)
MONO ABS: 0.8 x10 3/mm (ref 0.2–0.9)
Monocyte %: 6.6 %
NEUTROS ABS: 10 x10 3/mm — AB (ref 1.4–6.5)
NEUTROS PCT: 84.1 %
Platelet: 392 x10 3/mm (ref 150–440)
RBC: 3.6 10*6/uL — ABNORMAL LOW (ref 3.80–5.20)
RDW: 14.8 % — ABNORMAL HIGH (ref 11.5–14.5)
WBC: 11.9 x10 3/mm — ABNORMAL HIGH (ref 3.6–11.0)

## 2013-11-27 LAB — CULTURE, BLOOD (SINGLE)

## 2013-11-30 LAB — WOUND CULTURE

## 2013-12-02 LAB — COMPREHENSIVE METABOLIC PANEL
Albumin: 3.3 g/dL — ABNORMAL LOW (ref 3.4–5.0)
Alkaline Phosphatase: 122 U/L — ABNORMAL HIGH
Anion Gap: 9 (ref 7–16)
BUN: 11 mg/dL (ref 7–18)
Bilirubin,Total: 0.3 mg/dL (ref 0.2–1.0)
Calcium, Total: 9.2 mg/dL (ref 8.5–10.1)
Chloride: 95 mmol/L — ABNORMAL LOW (ref 98–107)
Co2: 28 mmol/L (ref 21–32)
Creatinine: 1.04 mg/dL (ref 0.60–1.30)
EGFR (African American): >60
EGFR (Non-African Amer.): 57 — ABNORMAL LOW
Glucose: 484 mg/dL — ABNORMAL HIGH (ref 65–99)
Osmolality: 285 (ref 275–301)
Potassium: 4 mmol/L (ref 3.5–5.1)
SGOT(AST): 17 U/L (ref 15–37)
SGPT (ALT): 28 U/L
Sodium: 132 mmol/L — ABNORMAL LOW (ref 136–145)
Total Protein: 7.7 g/dL (ref 6.4–8.2)

## 2013-12-02 LAB — CBC CANCER CENTER
Basophil #: 0.1 x10 3/mm (ref 0.0–0.1)
Basophil %: 0.6 %
Eosinophil #: 0.2 x10 3/mm (ref 0.0–0.7)
Eosinophil %: 2 %
HCT: 34.7 % — ABNORMAL LOW (ref 35.0–47.0)
HGB: 11 g/dL — ABNORMAL LOW (ref 12.0–16.0)
Lymphocyte #: 1.5 x10 3/mm (ref 1.0–3.6)
Lymphocyte %: 13.9 %
MCH: 28.5 pg (ref 26.0–34.0)
MCHC: 31.8 g/dL — ABNORMAL LOW (ref 32.0–36.0)
MCV: 90 fL (ref 80–100)
Monocyte #: 0.5 x10 3/mm (ref 0.2–0.9)
Monocyte %: 4.3 %
Neutrophil #: 8.5 x10 3/mm — ABNORMAL HIGH (ref 1.4–6.5)
Neutrophil %: 79.2 %
Platelet: 527 x10 3/mm — ABNORMAL HIGH (ref 150–440)
RBC: 3.86 10*6/uL (ref 3.80–5.20)
RDW: 14.7 % — ABNORMAL HIGH (ref 11.5–14.5)
WBC: 10.7 x10 3/mm (ref 3.6–11.0)

## 2013-12-06 LAB — CBC CANCER CENTER
BASOS ABS: 0.1 x10 3/mm (ref 0.0–0.1)
Basophil %: 0.6 %
Eosinophil #: 0.1 x10 3/mm (ref 0.0–0.7)
Eosinophil %: 1.4 %
HCT: 34.8 % — AB (ref 35.0–47.0)
HGB: 11.1 g/dL — ABNORMAL LOW (ref 12.0–16.0)
LYMPHS ABS: 1.5 x10 3/mm (ref 1.0–3.6)
Lymphocyte %: 14.7 %
MCH: 28.5 pg (ref 26.0–34.0)
MCHC: 31.8 g/dL — AB (ref 32.0–36.0)
MCV: 90 fL (ref 80–100)
Monocyte #: 0.5 x10 3/mm (ref 0.2–0.9)
Monocyte %: 4.7 %
Neutrophil #: 7.8 x10 3/mm — ABNORMAL HIGH (ref 1.4–6.5)
Neutrophil %: 78.6 %
Platelet: 425 x10 3/mm (ref 150–440)
RBC: 3.88 10*6/uL (ref 3.80–5.20)
RDW: 15.7 % — ABNORMAL HIGH (ref 11.5–14.5)
WBC: 10 x10 3/mm (ref 3.6–11.0)

## 2013-12-14 ENCOUNTER — Ambulatory Visit: Payer: Self-pay | Admitting: Oncology

## 2013-12-27 LAB — COMPREHENSIVE METABOLIC PANEL
ALBUMIN: 3.5 g/dL (ref 3.4–5.0)
ALT: 32 U/L
AST: 18 U/L (ref 15–37)
Alkaline Phosphatase: 116 U/L
Anion Gap: 6 — ABNORMAL LOW (ref 7–16)
BILIRUBIN TOTAL: 0.2 mg/dL (ref 0.2–1.0)
BUN: 18 mg/dL (ref 7–18)
CHLORIDE: 100 mmol/L (ref 98–107)
CO2: 32 mmol/L (ref 21–32)
Calcium, Total: 8.7 mg/dL (ref 8.5–10.1)
Creatinine: 1.02 mg/dL (ref 0.60–1.30)
EGFR (African American): >60
EGFR (Non-African Amer.): 58 — ABNORMAL LOW
Glucose: 251 mg/dL — ABNORMAL HIGH (ref 65–99)
Osmolality: 286 (ref 275–301)
Potassium: 3.8 mmol/L (ref 3.5–5.1)
SODIUM: 138 mmol/L (ref 136–145)
Total Protein: 7.4 g/dL (ref 6.4–8.2)

## 2013-12-27 LAB — CBC CANCER CENTER
Basophil #: 0.1 x10 3/mm (ref 0.0–0.1)
Basophil %: 0.6 %
EOS PCT: 1.4 %
Eosinophil #: 0.1 x10 3/mm (ref 0.0–0.7)
HCT: 34.2 % — AB (ref 35.0–47.0)
HGB: 11.2 g/dL — ABNORMAL LOW (ref 12.0–16.0)
LYMPHS PCT: 16.3 %
Lymphocyte #: 1.3 x10 3/mm (ref 1.0–3.6)
MCH: 28.9 pg (ref 26.0–34.0)
MCHC: 32.8 g/dL (ref 32.0–36.0)
MCV: 88 fL (ref 80–100)
Monocyte #: 0.4 x10 3/mm (ref 0.2–0.9)
Monocyte %: 5.1 %
NEUTROS ABS: 6.1 x10 3/mm (ref 1.4–6.5)
Neutrophil %: 76.6 %
Platelet: 339 x10 3/mm (ref 150–440)
RBC: 3.89 10*6/uL (ref 3.80–5.20)
RDW: 15.9 % — ABNORMAL HIGH (ref 11.5–14.5)
WBC: 8 x10 3/mm (ref 3.6–11.0)

## 2014-01-03 LAB — CBC CANCER CENTER
BASOS PCT: 0.6 %
Basophil #: 0 x10 3/mm (ref 0.0–0.1)
Eosinophil #: 0.1 x10 3/mm (ref 0.0–0.7)
Eosinophil %: 1.7 %
HCT: 32.7 % — ABNORMAL LOW (ref 35.0–47.0)
HGB: 10.6 g/dL — AB (ref 12.0–16.0)
LYMPHS PCT: 16.9 %
Lymphocyte #: 1.2 x10 3/mm (ref 1.0–3.6)
MCH: 28.8 pg (ref 26.0–34.0)
MCHC: 32.6 g/dL (ref 32.0–36.0)
MCV: 88 fL (ref 80–100)
Monocyte #: 0.5 x10 3/mm (ref 0.2–0.9)
Monocyte %: 7.1 %
Neutrophil #: 5.1 x10 3/mm (ref 1.4–6.5)
Neutrophil %: 73.7 %
PLATELETS: 297 x10 3/mm (ref 150–440)
RBC: 3.7 10*6/uL — AB (ref 3.80–5.20)
RDW: 15.5 % — ABNORMAL HIGH (ref 11.5–14.5)
WBC: 6.9 x10 3/mm (ref 3.6–11.0)

## 2014-01-10 LAB — CBC CANCER CENTER
Basophil #: 0 x10 3/mm (ref 0.0–0.1)
Basophil %: 0.4 %
EOS PCT: 2.1 %
Eosinophil #: 0.2 x10 3/mm (ref 0.0–0.7)
HCT: 33.6 % — AB (ref 35.0–47.0)
HGB: 11 g/dL — ABNORMAL LOW (ref 12.0–16.0)
LYMPHS ABS: 0.7 x10 3/mm — AB (ref 1.0–3.6)
LYMPHS PCT: 9.2 %
MCH: 28.3 pg (ref 26.0–34.0)
MCHC: 32.6 g/dL (ref 32.0–36.0)
MCV: 87 fL (ref 80–100)
MONOS PCT: 6.5 %
Monocyte #: 0.5 x10 3/mm (ref 0.2–0.9)
Neutrophil #: 6.6 x10 3/mm — ABNORMAL HIGH (ref 1.4–6.5)
Neutrophil %: 81.8 %
Platelet: 309 x10 3/mm (ref 150–440)
RBC: 3.87 10*6/uL (ref 3.80–5.20)
RDW: 15.3 % — ABNORMAL HIGH (ref 11.5–14.5)
WBC: 8.1 x10 3/mm (ref 3.6–11.0)

## 2014-01-14 ENCOUNTER — Ambulatory Visit: Payer: Self-pay | Admitting: Oncology

## 2014-01-17 LAB — HEPATIC FUNCTION PANEL A (ARMC)
ALK PHOS: 108 U/L
Albumin: 3.2 g/dL — ABNORMAL LOW (ref 3.4–5.0)
Bilirubin, Direct: <0.1 mg/dL (ref 0.0–0.2)
Bilirubin,Total: 0.2 mg/dL (ref 0.2–1.0)
SGOT(AST): 12 U/L — ABNORMAL LOW (ref 15–37)
SGPT (ALT): 25 U/L
Total Protein: 7.3 g/dL (ref 6.4–8.2)

## 2014-01-17 LAB — CBC CANCER CENTER
BASOS ABS: 0 x10 3/mm (ref 0.0–0.1)
Basophil %: 0.2 %
EOS ABS: 0.2 x10 3/mm (ref 0.0–0.7)
Eosinophil %: 1.6 %
HCT: 34.2 % — ABNORMAL LOW (ref 35.0–47.0)
HGB: 11.1 g/dL — AB (ref 12.0–16.0)
LYMPHS PCT: 7.6 %
Lymphocyte #: 0.8 x10 3/mm — ABNORMAL LOW (ref 1.0–3.6)
MCH: 28.4 pg (ref 26.0–34.0)
MCHC: 32.5 g/dL (ref 32.0–36.0)
MCV: 87 fL (ref 80–100)
MONO ABS: 0.5 x10 3/mm (ref 0.2–0.9)
Monocyte %: 4.8 %
NEUTROS ABS: 8.9 x10 3/mm — AB (ref 1.4–6.5)
Neutrophil %: 85.8 %
Platelet: 343 x10 3/mm (ref 150–440)
RBC: 3.91 10*6/uL (ref 3.80–5.20)
RDW: 15.1 % — ABNORMAL HIGH (ref 11.5–14.5)
WBC: 10.4 x10 3/mm (ref 3.6–11.0)

## 2014-01-17 LAB — MAGNESIUM: MAGNESIUM: 1.6 mg/dL — AB

## 2014-01-24 LAB — CBC CANCER CENTER
BASOS PCT: 0.4 %
Basophil #: 0 x10 3/mm (ref 0.0–0.1)
EOS ABS: 0.2 x10 3/mm (ref 0.0–0.7)
Eosinophil %: 1.9 %
HCT: 33 % — ABNORMAL LOW (ref 35.0–47.0)
HGB: 10.8 g/dL — ABNORMAL LOW (ref 12.0–16.0)
Lymphocyte #: 0.8 x10 3/mm — ABNORMAL LOW (ref 1.0–3.6)
Lymphocyte %: 7.8 %
MCH: 28.3 pg (ref 26.0–34.0)
MCHC: 32.8 g/dL (ref 32.0–36.0)
MCV: 86 fL (ref 80–100)
Monocyte #: 0.6 x10 3/mm (ref 0.2–0.9)
Monocyte %: 6.3 %
Neutrophil #: 8.3 x10 3/mm — ABNORMAL HIGH (ref 1.4–6.5)
Neutrophil %: 83.6 %
Platelet: 335 x10 3/mm (ref 150–440)
RBC: 3.82 10*6/uL (ref 3.80–5.20)
RDW: 15.5 % — ABNORMAL HIGH (ref 11.5–14.5)
WBC: 9.9 x10 3/mm (ref 3.6–11.0)

## 2014-01-31 LAB — CBC CANCER CENTER
BASOS PCT: 0.6 %
Basophil #: 0 x10 3/mm (ref 0.0–0.1)
EOS PCT: 3.1 %
Eosinophil #: 0.2 x10 3/mm (ref 0.0–0.7)
HCT: 33.2 % — ABNORMAL LOW (ref 35.0–47.0)
HGB: 11.1 g/dL — AB (ref 12.0–16.0)
LYMPHS ABS: 0.7 x10 3/mm — AB (ref 1.0–3.6)
Lymphocyte %: 9.7 %
MCH: 28.6 pg (ref 26.0–34.0)
MCHC: 33.3 g/dL (ref 32.0–36.0)
MCV: 86 fL (ref 80–100)
MONO ABS: 0.5 x10 3/mm (ref 0.2–0.9)
MONOS PCT: 6.7 %
NEUTROS PCT: 79.9 %
Neutrophil #: 5.9 x10 3/mm (ref 1.4–6.5)
Platelet: 350 x10 3/mm (ref 150–440)
RBC: 3.87 10*6/uL (ref 3.80–5.20)
RDW: 15.7 % — ABNORMAL HIGH (ref 11.5–14.5)
WBC: 7.4 x10 3/mm (ref 3.6–11.0)

## 2014-02-07 LAB — COMPREHENSIVE METABOLIC PANEL
ANION GAP: 7 (ref 7–16)
AST: 14 U/L — AB (ref 15–37)
Albumin: 3.3 g/dL — ABNORMAL LOW (ref 3.4–5.0)
Alkaline Phosphatase: 102 U/L
BUN: 16 mg/dL (ref 7–18)
Bilirubin,Total: 0.3 mg/dL (ref 0.2–1.0)
CHLORIDE: 101 mmol/L (ref 98–107)
Calcium, Total: 8.9 mg/dL (ref 8.5–10.1)
Co2: 30 mmol/L (ref 21–32)
Creatinine: 1.03 mg/dL (ref 0.60–1.30)
GFR CALC NON AF AMER: 58 — AB
Glucose: 176 mg/dL — ABNORMAL HIGH (ref 65–99)
Osmolality: 281 (ref 275–301)
POTASSIUM: 3.8 mmol/L (ref 3.5–5.1)
SGPT (ALT): 23 U/L
Sodium: 138 mmol/L (ref 136–145)
Total Protein: 7.3 g/dL (ref 6.4–8.2)

## 2014-02-07 LAB — CBC CANCER CENTER
Basophil #: 0 x10 3/mm (ref 0.0–0.1)
Basophil %: 0.5 %
EOS ABS: 0.3 x10 3/mm (ref 0.0–0.7)
Eosinophil %: 4.1 %
HCT: 33.6 % — AB (ref 35.0–47.0)
HGB: 11.3 g/dL — AB (ref 12.0–16.0)
Lymphocyte #: 0.7 x10 3/mm — ABNORMAL LOW (ref 1.0–3.6)
Lymphocyte %: 9.4 %
MCH: 35.2 pg — AB (ref 26.0–34.0)
MCHC: 33.5 g/dL (ref 32.0–36.0)
MCV: 105 fL — AB (ref 80–100)
MONO ABS: 0.5 x10 3/mm (ref 0.2–0.9)
Monocyte %: 5.9 %
NEUTROS ABS: 6.2 x10 3/mm (ref 1.4–6.5)
Neutrophil %: 80.1 %
Platelet: 331 x10 3/mm (ref 150–440)
RBC: 3.21 10*6/uL — AB (ref 3.80–5.20)
RDW: 15.2 % — ABNORMAL HIGH (ref 11.5–14.5)
WBC: 7.8 x10 3/mm (ref 3.6–11.0)

## 2014-02-14 ENCOUNTER — Ambulatory Visit: Payer: Self-pay | Admitting: Oncology

## 2014-03-15 ENCOUNTER — Ambulatory Visit
Admit: 2014-03-15 | Disposition: A | Discharge status: Home / Self Care | Payer: Self-pay | Attending: Oncology | Admitting: Oncology

## 2014-03-21 LAB — CREATININE, SERUM
Creatine, Serum: 0.96
Creatinine, Ser: 0.96 mg/dL

## 2014-04-13 ENCOUNTER — Ambulatory Visit: Admit: 2014-04-13 | Disposition: A | Discharge status: Home / Self Care | Payer: Self-pay | Admitting: Surgery

## 2014-04-15 ENCOUNTER — Ambulatory Visit
Admit: 2014-04-15 | Disposition: A | Discharge status: Home / Self Care | Payer: Self-pay | Attending: Oncology | Admitting: Oncology

## 2014-04-22 ENCOUNTER — Encounter: Payer: Self-pay | Admitting: *Deleted

## 2014-05-02 LAB — COMPREHENSIVE METABOLIC PANEL
ALBUMIN: 3.7 g/dL
ALT: 20 U/L
AST: 23 U/L
Alkaline Phosphatase: 74 U/L
Anion Gap: 8 (ref 7–16)
BUN: 19 mg/dL
Bilirubin,Total: 0.5 mg/dL
Calcium, Total: 8.8 mg/dL — ABNORMAL LOW
Chloride: 101 mmol/L
Co2: 25 mmol/L
Creatinine: 0.99 mg/dL
EGFR (African American): >60
Glucose: 242 mg/dL — ABNORMAL HIGH
POTASSIUM: 4.1 mmol/L
Sodium: 134 mmol/L — ABNORMAL LOW
TOTAL PROTEIN: 7.2 g/dL

## 2014-05-02 LAB — CBC CANCER CENTER
BASOS PCT: 0.5 %
Basophil #: 0 x10 3/mm (ref 0.0–0.1)
EOS ABS: 0.1 x10 3/mm (ref 0.0–0.7)
Eosinophil %: 1.6 %
HCT: 34.2 % — AB (ref 35.0–47.0)
HGB: 11.2 g/dL — AB (ref 12.0–16.0)
LYMPHS ABS: 1.1 x10 3/mm (ref 1.0–3.6)
Lymphocyte %: 13.6 %
MCH: 27.8 pg (ref 26.0–34.0)
MCHC: 32.8 g/dL (ref 32.0–36.0)
MCV: 85 fL (ref 80–100)
MONO ABS: 0.4 x10 3/mm (ref 0.2–0.9)
Monocyte %: 5.3 %
NEUTROS ABS: 6.3 x10 3/mm (ref 1.4–6.5)
Neutrophil %: 79 %
Platelet: 289 x10 3/mm (ref 150–440)
RBC: 4.04 10*6/uL (ref 3.80–5.20)
RDW: 15 % — ABNORMAL HIGH (ref 11.5–14.5)
WBC: 8 x10 3/mm (ref 3.6–11.0)

## 2014-05-07 NOTE — Op Note (Signed)
PATIENT NAME:  Mary Brady, Mary Brady MR#:  [id number] DATE OF BIRTH:  August 09, 1950  DATE OF PROCEDURE:  10/05/2013  PREOPERATIVE DIAGNOSIS: Carcinoma of the left breast.   POSTOPERATIVE DIAGNOSIS: Carcinoma of the right breast.   PROCEDURE: Left modified radical mastectomy.   SURGEON: Loreli Dollar, M.D.   ANESTHESIA: General.   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 She had preoperative chemotherapy, and now comes for definitive surgical procedure.   PROCEDURE IN DETAIL: The patient was placed on the operating table in the supine position under general anesthesia. The left arm was placed on a lateral arm support. The left breast and chest wall were prepared with ChloraPrep and draped in a sterile manner.   A transversely oriented curvilinear incision was made above and below the breast. Skin and subcutaneous flaps were raised using silk sutures for traction and electrocautery. Dissection was carried medially to the sternum, and superiorly in the direction of the clavicle, laterally to the latissimus dorsi muscle and inferiorly to the inferior mammary fold. The breast was elevated off the underlying pectoralis major fascia using electrocautery for hemostasis. The dissection was carried out up into the axilla, and removed the axillary contents, dissecting up to identify the axillary vein and also thoracodorsal nerve. The axillary contents were removed. There were several identifiable nodes and submitted fresh for routine pathology.   Multiple clamped axillary vessels were suture ligated with 3-0 chromic. Several suture ligatures were carried out during the course of the mastectomy. Hemostasis subsequently appeared to be intact. Two 19 French Blake drains were placed through separate inferior stab wounds, one directed up into the axilla, and the other  along the chest wall. These were sutured in place with 3-0 nylon. Next, after monitoring the wound for several minutes and seeing hemostasis was intact, the wound was closed with a running 4-0 Monocryl subcuticular suture. The wound was then dressed with Dermabond. The drain sites were dressed with cotton gauze, benzoin and 2-inch silk tape.   The patient tolerated the procedure satisfactorily and was carried to the recovery room for postoperative care.    ____________________________ J. Rochel Brome, MD jws:JT D: 10/05/2013 11:34:35 ET T: 10/05/2013 12:40:40 ET JOB#: [id number]  cc: Loreli Dollar, MD, <Dictator> Loreli Dollar MD ELECTRONICALLY SIGNED 10/06/2013 13:22

## 2014-05-07 NOTE — Op Note (Signed)
PATIENT NAME:  Mary Brady, Mary Brady MR#:  [id number] DATE OF BIRTH:  [birth year]  DATE OF PROCEDURE:  02/08/2013  PREOPERATIVE DIAGNOSIS: Cataract, right eye.   POSTOPERATIVE DIAGNOSIS: Cataract, right eye.  PROCEDURE PERFORMED: Extracapsular cataract extraction using phacoemulsification with placement  Alcon [id number], 19.0 diopter posterior chamber lens, serial number [id number]   SURGEON: Remo Lipps A. Patryk Conant, M.D.   ANESTHESIA: 4% lidocaine and 0.75% Marcaine a 50-50 mixture with 10 units/mL of HyoMax added, given as a peribulbar.   ANESTHESIOLOGIST: Dr. Myra Gianotti.   COMPLICATIONS: None.   ESTIMATED BLOOD LOSS: Less than 1 mL.    DESCRIPTION OF PROCEDURE:  The patient was brought to the operating room and given a peribulbar block.  The patient was then prepped and draped in the usual fashion.  The vertical rectus muscles were imbricated using 5-0 silk sutures.  These sutures were then clamped to the sterile drapes as bridle sutures.  A limbal peritomy was performed extending two clock hours and hemostasis was obtained with cautery.  A partial thickness scleral groove was made at the surgical limbus and dissected anteriorly in a lamellar dissection using an Alcon crescent knife.  The anterior chamber was entered superonasally with a Superblade and through the lamellar dissection with a 2.6 mm keratome.  DisCoVisc was used to replace the aqueous and a continuous tear capsulorrhexis was carried out.  Hydrodissection and hydrodelineation were carried out with balanced salt and a 27 gauge canula.  The nucleus was rotated to confirm the effectiveness of the hydrodissection.  Phacoemulsification was carried out using a divide-and-conquer technique.  Total ultrasound time was 43 seconds with an average power of 20.3 percent. CDE of 16.48.   Irrigation/aspiration was used to remove the residual cortex.  DisCoVisc was used to inflate the capsule and the internal incision was enlarged to 3 mm with the  crescent knife.  The intraocular lens was folded and inserted into the capsular bag using the AcrySert delivery system. Irrigation/aspiration was used to remove the residual DisCoVisc.  Miostat was injected into the anterior chamber through the paracentesis track to inflate the anterior chamber and induce miosis.  A tenth of a milliliter of Vigamox containing 0.1 mg of drug was injected via the paracentesis track. The wound was checked for leaks and none were found. The conjunctiva was closed with cautery and the bridle sutures were removed.  Two drops of 0.3% Vigamox were placed on the eye.   An eye shield was placed on the eye.  The patient was discharged to the recovery room in good condition.    ____________________________ Loura Back Meghanne Pletz, MD sad:sg D: 02/08/2013 11:51:47 ET T: 02/08/2013 12:50:38 ET JOB#: [id number]  cc: Remo Lipps A. Elwyn Klosinski, MD, <Dictator> Martie Lee MD ELECTRONICALLY SIGNED 02/08/2013 13:37

## 2014-05-07 NOTE — Consult Note (Signed)
Reason for Visit: This 64 year old Female patient presents to the clinic for initial evaluation of  breast cancer .   Referred by Dr. Oliva Bustard.  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, N1, M0 with excellent response to neoadjuvant chemotherapy status post left modified radical mastectomy and axillary lymph node dissection now for adjuvant chest wall and peripheral lymphatic radiation  Pathology Report pathology report reviewed   Imaging Report mammograms and PET CT scans reviewed   Referral Report clinical notes reviewed   Planned Treatment Regimen adjuvant chest wall and peripheral lymphatic radiation   HPI   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 Initial mammogram revealed a 4 cm mass and enlarged axillary lymph nodes. Biopsy of both were positive for invasive mammary carcinoma weakly ER/PR positive HER-2/neu overexpressed.PET CT demonstrated a large hypermetabolic left breast mass also hypermetabolic left axillary and subpectoral metastatic nodes with no evidence of distant disease. She was recruited to the NSABP protocol B. 52 using carboplatinum Taxol Herceptin and progenitor and she finished 6 cycles. Tolerated her treatments well. She then underwent a left modified radical mastectomy and axillary lymph node dissection showing multifocal high grade invasive mammary carcinoma allergic focus being 5 mm. 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 12 expiratory lymph nodes were all negative for metastatic disease margins were negative. Lymph vascular invasion was positive no dermal invasion was noted. She still has a drain present although has done well. They are planning to do breast reconstruction after completionadiation. She  is otherwise doing well specifically denies chest pain cough or bone pain.  Past Hx:    Oncology Protocol: Pt participating in [facility]ABP B-52 research study receiving Taxotere, Carboplatin, Herceptin & Pertuzumab every 3 weeks x 6 cycles beginning 05/10/13. Yolande Jolly is research contact. Call [phone] for any questions or problems.   Arthritis:    hyperthyroidism:    depression:    Cataracts:    Diabetes:   Past, Family and Social History:  Past Medical History positive   Endocrine diabetes mellitus; hyperthyroidism   Neurological/Psychiatric depression   Past Medical History Comments arthritis, cataracts   Family History positive   Family History Comments nieces with history of breast cancer no other significant family history   Social History noncontributory   Additional Past Medical and Surgical History seen by herself today   Allergies:   Penicillin: Rash  Demerol: Other  Home Meds:  Home Medications: Medication Instructions Status  Slow-Mag 119 mg-71.5 mg oral delayed release tablet 1 tab(s) orally 2 times a day Active  acetaminophen-HYDROcodone 325 mg-5 mg oral tablet 1-2 tab(s) orally every 4 hours, As needed, moderate pain  Active  potassium chloride 20 mEq oral tablet, extended release 1 tab(s) orally once a day Active  triamcinolone topical 0.1% topical cream mixed with Eucerin cream Apply topically to affected area 2 times a day after bathing Active  ondansetron 4 mg oral tablet 1 tab(s) orally every 6 hours, As Needed chemtherapy unduced nausea and vomiting Active  levothyroxine 175 mcg (0.175 mg) oral tablet 1 tab(s) orally once a day (at bedtime) Active  Tylenol Extra Strength 500 mg oral tablet 2 tab(s) orally every 6 hours, As Needed - for Pain Active  bisoprolol-hydrochlorothiazide 5 mg-6.25 mg oral tablet 1 tab(s) orally once a day (at bedtime) Active  Januvia  100 mg oral tablet 1 tab(s) orally once a day Active  pioglitazone 15 mg oral tablet 1  tab(s) orally once a day Active  glimepiride 4 mg oral tablet 1 tab(s) orally 2 times a day Active  simvastatin 20 mg oral tablet 1 tab(s) orally once a day (at bedtime) Active  loratadine 10 mg oral tablet 1 tab(s) orally once a day Active  FLUoxetine 20 mg oral tablet 1 tab(s) orally once a day Active  Calcium 500+D 500 mg-400 intl units oral tablet, chewable 1 tab(s) orally 2 times a day Active   Review of Systems:  General negative   Performance Status (ECOG) 0   Skin negative   Breast see HPI   Ophthalmologic negative   ENMT negative   Respiratory and Thorax negative   Cardiovascular negative   Gastrointestinal negative   Genitourinary negative   Musculoskeletal negative   Neurological negative   Psychiatric negative   Hematology/Lymphatics negative   Endocrine negative   Allergic/Immunologic negative   Review of Systems   denies any weight loss, fatigue, weakness, fever, chills or night sweats. Patient denies any loss of vision, blurred vision. Patient denies any ringing  of the ears or hearing loss. No irregular heartbeat. Patient denies heart murmur or history of fainting. Patient denies any chest pain or pain radiating to her upper extremities. Patient denies any shortness of breath, difficulty breathing at night, cough or hemoptysis. Patient denies any swelling in the lower legs. Patient denies any nausea vomiting, vomiting of blood, or coffee ground material in the vomitus. Patient denies any stomach pain. Patient states has had normal bowel movements no significant constipation or diarrhea. Patient denies any dysuria, hematuria or significant nocturia. Patient denies any problems walking, swelling in the joints or loss of balance. Patient denies any skin changes, loss of hair or loss of weight. Patient denies any excessive worrying or anxiety or significant depression. Patient denies any problems with insomnia. Patient denies excessive thirst, polyuria,  polydipsia. Patient denies any swollen glands, patient denies easy bruising or easy bleeding. Patient denies any recent infections, allergies or URI. Patient "s visual fields have not changed significantly in recent time.   Nursing Notes:  Nursing Vital Signs and Chemo Nursing Nursing Notes: *CC Vital Signs Flowsheet:   14-Oct-15 10:20  Temp Temperature 97  Pulse Pulse 80  Respirations Respirations 21  SBP SBP 113  DBP DBP 72  Current Weight (kg) (kg) 95.5  Height (cm) centimeters 168  BSA (m2) 2   Physical Exam:  General/Skin/HEENT:  General normal   Skin normal   Eyes normal   ENMT normal   Head and Neck normal   Additional PE well-developed well-nourished female in NAD. She is status post left modified radical mastectomy with incision healing well. Lungs are clear to A&P cardiac examination shows regular rate and rhythm. Right breast is free of dominant mass or nodularity in 2 positions examined. No evidence of chest wall nodularity is noted. No lymphedema of the left upper extremity is noted. Abdomen is benign.   Breasts/Resp/CV/GI/GU:  Respiratory and Thorax normal   Cardiovascular normal   Gastrointestinal normal   Genitourinary normal   MS/Neuro/Psych/Lymph:  Musculoskeletal normal   Neurological normal   Lymphatics normal   Other Results:  Radiology Results: LabUnknown:    25-Mar-15 15:28, Digital Diagnostic Mammogram Bilateral  PACS Image     24-Apr-15 14:07, PET/CT Scan Breast CA Stage/Restaging  PACS [place]:    25-Mar-15 15:28, Digital Diagnostic Mammogram  Bilateral  Digital Diagnostic Mammogram Bilateral   REASON FOR EXAM:    LT BR MASS AT Tresa Moore AND NIPPLE INDENTION AND YRLY  COMMENTS:       PROCEDURE: MAM - MAM DGTL DIAGNOSTIC MAMMO W/CAD  - Apr 07 2013  3:28PM     CLINICAL DATA:  Palpable mass within the left breast.    EXAM:  DIGITAL DIAGNOSTIC  bilateral MAMMOGRAM WITH CAD    ULTRASOUND left BREAST    COMPARISON:   03/04/2012, 01/14/2011, 01/09/2010.    ACR Breast Density Category c: The breast tissue is heterogeneously  dense, which may obscure small masses.    FINDINGS:  There is diffusely increased density within the subareolar portion  of the left breast. This is associated with nipple retraction and  increased density and thickening of the skin associated with the  nipple. There is a multinodular parenchymal pattern. The right  breast is unchanged.    Mammographic images were processed with CAD.    On physical exam, there is a firm palpable mass associated with  subareolar portion of the left breast. There is mild skin erythema  surrounding the left nipple. In addition, there is skin thickening  associated with the left nipple. There is no palpable left axillary  adenopathy.    Ultrasound is performed, showing an irregularly marginated  hypoechoic mass within the subareolar portion of the left breast  extending superiorly at the 12 o'clock position. This measures 4.0 x  3.4 x 1.8 cm in size. This is suspicious for invasive mammary  carcinoma.    Ultrasound of the left axilla demonstrates at least 3 abnormal  appearing level 1 low axillary lymph nodes to be present. These are  all associated with loss of a normal fatty hilum and a rounded  configuration. The largest lymph node measures 1.4 x 1.4 x 1.1 cm in  size.    I have discussed the findings with the patient. I recommend left  breast ultrasound-guided core biopsy ofthe subareolar mass and also  the largest of the level 1 left axillary lymph nodes. This will be  scheduled per patient preference.     IMPRESSION:  1. 4 cm irregular mass located within the subareolar portion of the  left breast with nipple retraction and thickening of the skin of the  nipple. This is worrisome for invasive mammary carcinoma.  2. At least 3 abnormal appearing level 1 left axillary lymph nodes.  These are worrisome for metastatic left axillary  adenopathy.    RECOMMENDATION:  Leftbreast ultrasound-guided core biopsy of the subareolar mass and  at ultrasound-guided core biopsy of the largest left axillary lymph  node.  I have discussed the findings and recommendations with the patient.  Results were also provided in writing atthe conclusion of the  visit. If applicable, a reminder letter will be sent to the patient  regarding the next appointment.    BI-RADS CATEGORY  5: Highly suggestive of malignancy - appropriate  action should be taken.      Electronically Signed    By: Luberta Robertson M.D.    On: 04/07/2013 16:42         Verified By: Lucy Antigua, M.D.,  Nuclear Med:    24-Apr-15 14:07, PET/CT Scan Breast CA Stage/Restaging  PET/CT Scan Breast CA Stage/Restaging   REASON FOR EXAM:    Breast Initial Stage  COMMENTS:       PROCEDURE: PET - PET/CT RESTG BREAST CA  - May 07 2013  2:07PM  CLINICAL DATA:  Initial treatment strategy for left breast  carcinoma.Marland Kitchen    EXAM:  NUCLEAR MEDICINE PET SKULL BASE TO THIGH    TECHNIQUE:  12.7 mCi F-18 FDG was injected intravenously. Full-ring PET imaging  was performed from the skull base to thigh after the radiotracer. CT  data was obtained and used for attenuation correction and anatomic  localization.    FASTING BLOOD GLUCOSE:  Value: 164 mg/dl    COMPARISON:  Chest radiograph 05/07/2013    FINDINGS:  NECK    No hypermetabolic lymph nodes in the neck.    CHEST    There is a hypermetabolic left breast mass with a central biopsy  clip measuring 3.7 cm (image 87) withSUV max equal 8.2. There is a  cluster of 4 hypermetabolic left axillary lymph nodes (image 84)  with SUV max 2 6.7. There is a more medial hypermetabolic lymph node  beneath the subpectoral is muscle on the left measuring 7 mm with  SUV max 2 or 3.2(image 64 fused series).    There is no infraclavicular lymph node. No hypermetabolic internal  mammary nodes. No mediastinal hypermetabolic  nodes.    Review of the lung parenchyma all demonstrates no suspicious  pulmonary nodules.    ABDOMEN/PELVIS    No abnormal hypermetabolic activity within the liver, pancreas,  adrenal glands, or spleen. No hypermetabolic lymph nodes in the  abdomen or pelvis.    SKELETON    No focal hypermetabolic activity to suggest skeletal metastasis.     IMPRESSION:  1.Hypermetabolic left breast mass consists with primary breast  carcinoma.  2. Hypermetabolic left axillary and subpectoral  metastatic nodes.  3. No central hypermetabolic nodal metastasis. No pulmonary  metastasis.  4. No evidence of distant metastasis    Electronically Signed    By: Suzy Bouchard M.D.    On: 05/07/2013 15:01         Verified By: Rennis Golden, M.D.,   Relevent Results:   Relevant Scans and Labs mammograms and PET CT scan reviewed   Assessment and Plan: Impression:   64 year old female with borderline ER/PR invasive mammary carcinoma with HER-2/neu overexpressed status post neoadjuvant chemotherapy with excellent response status post left modified radical mastectomy and axillary node dissection with minimal residual disease. Plan:   at this time based on her poor prognostic factors including initial large tumor mass encroachment on the nipple areolar complex and skin multiple large axillary lymph nodes and subpectoral lymph nodes would recommend adjuvant radiation therapy to her left chest wall and peripheral lymphatics. I will plan on delivering 5000 cGy to both areas boosting her scar another 1400 cGy using electron beam. Risks and benefits of treatment include skin reaction, inclusion of some superficial lung, possibility of lymphedema alteration of blood counts and fatigue all were discussed in detail. Also that reconstruction might be difficult although tissue expanders are planned I assured her that she should have good quality of reconstruction possible. I would like to skin to heal little more  and have the drain removed so I set up and ordered CT simulation in about a week's time. I've also informed her to exercise her left upper extremity in the future to prevent lymphedema.  I would like to take this opportunity for allowing me to participate in the care of your patient..  Fax to Physician:  Physicians To Recieve Fax: Jarome Lamas, MD - [phone] Rochel Brome, MD - [phone].  Electronic Signatures: Armstead Peaks (MD)  (Signed 14-Oct-15 11:01)  Authored: HPI, Diagnosis, Past Hx, PFSH, Allergies, Home Meds, ROS, Nursing Notes, Physical Exam, Other Results, Relevent Results, Encounter Assessment and Plan, Fax to Physician   Last Updated: 14-Oct-15 11:01 by Armstead Peaks (MD)

## 2014-05-07 NOTE — Op Note (Signed)
PATIENT NAME:  Mary Brady, Mary Brady MR#:  [id number] DATE OF BIRTH:  07-09-1950  DATE OF PROCEDURE:  05/07/2013  PREOPERATIVE DIAGNOSIS: Carcinoma of the left breast.   POSTOPERATIVE DIAGNOSIS: Carcinoma of the left breast.   PROCEDURES PERFORMED:  1.  Insertion of central venous catheter with subcutaneous infusion port, left breast.  2.  Skin biopsy, left breast.  3.  Core needle biopsies.   SURGEON: Rochel Brome, MD  ANESTHESIA: Local 1% Xylocaine with epinephrine with monitored anesthesia care.   INDICATIONS: This 64 year old female has developed a mass in the central aspect of the left breast with surrounding erythema. She has had biopsy demonstrating invasive cancer, now needing central venous access for chemotherapy. She has also been enrolled in the NSABP-B52 protocol needing core biopsies and skin biopsy for further evaluation anticipating chemotherapy.   DESCRIPTION OF PROCEDURE: The patient was placed on the operating table in the supine position. A rolled sheet was placed behind the shoulder blades so that the neck was extended and the head placed on a doughnut ring. She was monitored and sedated by the anesthesia staff. The neck was extended. The neck and right chest wall and the left breast were prepared with ChloraPrep and draped in a sterile manner.   Initially, the Port-A-Cath was inserted. The skin beneath the clavicle was infiltrated with 1% Xylocaine with epinephrine. A transversely oriented 3 cm incision was made below the clavicle. Dissection was carried down approximately 1.8 cm deep and created a subcutaneous pouch inferior to the incision using electrocautery for hemostasis. This pouch was made large enough to admit the Niland port. Next, the patient was placed in the Trendelenburg position. The right jugular vein was identified with ultrasound and also demonstrated the normal-appearing anatomy of the carotid artery. The skin overlying the jugular vein and the neck was prepared  with injection of 1% Xylocaine with epinephrine and then made a lancing 5 mm incision and dissection was carried down into the subcutaneous tissues with a hemostat. Next, using ultrasound guidance, a needle was inserted into the jugular vein and aspirated dark blood. Next, a guidewire was inserted. An ultrasonic image was saved for the paper chart. Fluoroscopy was brought in and demonstrated location of the guidewire in the vena cava. Next, having removed the needle, the dilator and introducer sheath were advanced over the guidewire. The guidewire and dilator were removed. The catheter was passed down through the sheath and the sheath was peeled away. The guidewire was positioned at the 12 cm mark, and the fluoroscopy demonstrated the tip of the catheter in the superior vena cava, and a fluoroscopic image was saved for the paper chart. Next, the catheter was tunneled from the cervical incision down to the subclavian incision. Pressure was held over the tunnel site as the patient was placed in the reverse Trendelenburg position. Subsequently, the catheter was cut to fit. It was attached to the [place] port using the accompanying sleeve. The port was accessed with a Huber needle and aspirated a trace of blood and flushed with 10 mL of saline solution. Next, the port was placed into the subcutaneous pouch, and I could see that hemostasis was intact. It was attached to the fatty tissue with 4-0 silk. Next, the pouch was closed with 5-0 Vicryl and then the skin at both sites closed with 5-0 Vicryl subcuticular suture and Dermabond.   The patient appeared to be in satisfactory condition. Next, a hole was cut out in the sheet overlying the left breast exposing the breast  and could see the erythema surrounding the nipple and areola and extending out away from the nipple and areola. The mass was best palpable at the 11 o'clock position, in the retroareolar area, and it was at this point right at the edge of the border of the  areola a curvilinear elliptical excision of skin was done. The ellipse was approximately 1.8 cm in length and approximately 6 mm in width and dissected down to include a small amount of subcutaneous tissue with it and was sent fresh for pathology. Next, 5 core biopsies were done of the mass using the Achieve 16-gauge needle, and it appeared to have satisfactory biopsies. Next, I did note 1 bleeding point which was cauterized and then suture ligated with 5-0 Vicryl. Next, the wound was closed with running 5-0 Vicryl subcuticular suture and Dermabond.   The patient tolerated surgery satisfactorily. It was noted that the breast skin specimen had been sent fresh to pathology and also the oncology personnel processed the core biopsies with immediate freezing with dry ice and will deliver the core biopsies for additional study.   The patient was then transferred to the recovery room anticipating a postoperative chest x-ray and soon to be discharged from the outpatient surgery department.  ____________________________ J. Rochel Brome, MD jws:sb D: 05/07/2013 11:18:14 ET T: 05/07/2013 11:55:41 ET JOB#: [id number]  cc: Loreli Dollar, MD, <Dictator> Loreli Dollar MD ELECTRONICALLY SIGNED 05/14/2013 9:29

## 2014-05-27 ENCOUNTER — Other Ambulatory Visit: Payer: Self-pay | Admitting: Oncology

## 2014-06-06 ENCOUNTER — Other Ambulatory Visit: Payer: Self-pay

## 2014-06-06 ENCOUNTER — Ambulatory Visit: Payer: Self-pay | Admitting: Oncology

## 2014-06-14 ENCOUNTER — Encounter (INDEPENDENT_AMBULATORY_CARE_PROVIDER_SITE_OTHER): Payer: Self-pay

## 2014-06-14 ENCOUNTER — Inpatient Hospital Stay: Payer: BC Managed Care – PPO | Attending: Oncology

## 2014-06-14 DIAGNOSIS — C801 Malignant (primary) neoplasm, unspecified: Secondary | ICD-10-CM

## 2014-06-14 DIAGNOSIS — Z170 Estrogen receptor positive status [ER+]: Secondary | ICD-10-CM | POA: Insufficient documentation

## 2014-06-14 DIAGNOSIS — C50012 Malignant neoplasm of nipple and areola, left female breast: Secondary | ICD-10-CM | POA: Insufficient documentation

## 2014-06-14 DIAGNOSIS — Z79811 Long term (current) use of aromatase inhibitors: Secondary | ICD-10-CM | POA: Diagnosis not present

## 2014-06-14 DIAGNOSIS — C773 Secondary and unspecified malignant neoplasm of axilla and upper limb lymph nodes: Secondary | ICD-10-CM | POA: Diagnosis not present

## 2014-06-14 MED ORDER — HEPARIN SOD (PORK) LOCK FLUSH 100 UNIT/ML IV SOLN
INTRAVENOUS | Status: AC
  Filled 2014-06-14: qty 5

## 2014-06-14 MED ORDER — HEPARIN SOD (PORK) LOCK FLUSH 100 UNIT/ML IV SOLN
500.0000 [IU] | Freq: Once | INTRAVENOUS | Status: AC
  Administered 2014-06-14: 500 [IU] via INTRAVENOUS

## 2014-06-14 MED ORDER — SODIUM CHLORIDE 0.9 % IJ SOLN
10.0000 mL | Freq: Once | INTRAMUSCULAR | Status: AC
  Administered 2014-06-14: 10 mL via INTRAVENOUS
  Filled 2014-06-14: qty 10

## 2014-07-29 ENCOUNTER — Other Ambulatory Visit: Payer: Self-pay | Admitting: *Deleted

## 2014-07-29 DIAGNOSIS — C50919 Malignant neoplasm of unspecified site of unspecified female breast: Secondary | ICD-10-CM

## 2014-08-01 ENCOUNTER — Inpatient Hospital Stay: Payer: BC Managed Care – PPO

## 2014-08-01 ENCOUNTER — Inpatient Hospital Stay (HOSPITAL_BASED_OUTPATIENT_CLINIC_OR_DEPARTMENT_OTHER): Payer: BC Managed Care – PPO | Admitting: Oncology

## 2014-08-01 ENCOUNTER — Encounter: Payer: Self-pay | Admitting: Oncology

## 2014-08-01 ENCOUNTER — Inpatient Hospital Stay: Payer: BC Managed Care – PPO | Attending: Oncology

## 2014-08-01 VITALS — BP 146/82 | HR 66 | Temp 97.5°F | Resp 20 | Ht 66.0 in | Wt 218.9 lb

## 2014-08-01 DIAGNOSIS — Z79899 Other long term (current) drug therapy: Secondary | ICD-10-CM | POA: Diagnosis not present

## 2014-08-01 DIAGNOSIS — M1990 Unspecified osteoarthritis, unspecified site: Secondary | ICD-10-CM | POA: Insufficient documentation

## 2014-08-01 DIAGNOSIS — E119 Type 2 diabetes mellitus without complications: Secondary | ICD-10-CM

## 2014-08-01 DIAGNOSIS — Z9221 Personal history of antineoplastic chemotherapy: Secondary | ICD-10-CM | POA: Insufficient documentation

## 2014-08-01 DIAGNOSIS — Z9012 Acquired absence of left breast and nipple: Secondary | ICD-10-CM | POA: Insufficient documentation

## 2014-08-01 DIAGNOSIS — Z923 Personal history of irradiation: Secondary | ICD-10-CM

## 2014-08-01 DIAGNOSIS — Z170 Estrogen receptor positive status [ER+]: Secondary | ICD-10-CM | POA: Diagnosis not present

## 2014-08-01 DIAGNOSIS — C50912 Malignant neoplasm of unspecified site of left female breast: Secondary | ICD-10-CM | POA: Diagnosis not present

## 2014-08-01 DIAGNOSIS — I10 Essential (primary) hypertension: Secondary | ICD-10-CM | POA: Insufficient documentation

## 2014-08-01 DIAGNOSIS — N938 Other specified abnormal uterine and vaginal bleeding: Secondary | ICD-10-CM | POA: Insufficient documentation

## 2014-08-01 DIAGNOSIS — E039 Hypothyroidism, unspecified: Secondary | ICD-10-CM | POA: Insufficient documentation

## 2014-08-01 DIAGNOSIS — C50919 Malignant neoplasm of unspecified site of unspecified female breast: Secondary | ICD-10-CM

## 2014-08-01 DIAGNOSIS — Z79811 Long term (current) use of aromatase inhibitors: Secondary | ICD-10-CM | POA: Insufficient documentation

## 2014-08-01 DIAGNOSIS — Z8639 Personal history of other endocrine, nutritional and metabolic disease: Secondary | ICD-10-CM | POA: Insufficient documentation

## 2014-08-01 DIAGNOSIS — Z86010 Personal history of colonic polyps: Secondary | ICD-10-CM | POA: Insufficient documentation

## 2014-08-01 LAB — COMPREHENSIVE METABOLIC PANEL
ALBUMIN: 3.9 g/dL (ref 3.5–5.0)
ALT: 21 U/L (ref 14–54)
AST: 23 U/L (ref 15–41)
Alkaline Phosphatase: 79 U/L (ref 38–126)
Anion gap: 5 (ref 5–15)
BUN: 18 mg/dL (ref 6–20)
CALCIUM: 8.7 mg/dL — AB (ref 8.9–10.3)
CHLORIDE: 103 mmol/L (ref 101–111)
CO2: 27 mmol/L (ref 22–32)
Creatinine, Ser: 0.82 mg/dL (ref 0.44–1.00)
GLUCOSE: 138 mg/dL — AB (ref 65–99)
POTASSIUM: 3.8 mmol/L (ref 3.5–5.1)
Sodium: 135 mmol/L (ref 135–145)
TOTAL PROTEIN: 7.4 g/dL (ref 6.5–8.1)
Total Bilirubin: 0.4 mg/dL (ref 0.3–1.2)

## 2014-08-01 LAB — CBC WITH DIFFERENTIAL/PLATELET
BASOS ABS: 0 10*3/uL (ref 0–0.1)
BASOS PCT: 1 %
EOS PCT: 2 %
Eosinophils Absolute: 0.1 10*3/uL (ref 0–0.7)
HEMATOCRIT: 35.8 % (ref 35.0–47.0)
HEMOGLOBIN: 11.7 g/dL — AB (ref 12.0–16.0)
LYMPHS PCT: 16 %
Lymphs Abs: 1.1 10*3/uL (ref 1.0–3.6)
MCH: 27.5 pg (ref 26.0–34.0)
MCHC: 32.8 g/dL (ref 32.0–36.0)
MCV: 83.9 fL (ref 80.0–100.0)
Monocytes Absolute: 0.6 10*3/uL (ref 0.2–0.9)
Monocytes Relative: 8 %
NEUTROS ABS: 5 10*3/uL (ref 1.4–6.5)
Neutrophils Relative %: 73 %
Platelets: 284 10*3/uL (ref 150–440)
RBC: 4.26 MIL/uL (ref 3.80–5.20)
RDW: 14.5 % (ref 11.5–14.5)
WBC: 6.8 10*3/uL (ref 3.6–11.0)

## 2014-08-01 MED ORDER — HEPARIN SOD (PORK) LOCK FLUSH 100 UNIT/ML IV SOLN
INTRAVENOUS | Status: AC
  Filled 2014-08-01: qty 5

## 2014-08-01 MED ORDER — SODIUM CHLORIDE 0.9 % IJ SOLN
10.0000 mL | Freq: Once | INTRAMUSCULAR | Status: AC
  Administered 2014-08-01: 10 mL via INTRAVENOUS
  Filled 2014-08-01: qty 10

## 2014-08-01 MED ORDER — HEPARIN SOD (PORK) LOCK FLUSH 100 UNIT/ML IV SOLN
500.0000 [IU] | Freq: Once | INTRAVENOUS | Status: AC
  Administered 2014-08-01: 500 [IU] via INTRAVENOUS

## 2014-08-01 NOTE — Progress Notes (Signed)
Patient does not have a living will. Patient does not smoke.

## 2014-08-02 LAB — CANCER ANTIGEN 27.29: CA 27.29: 15.7 U/mL (ref 0.0–38.6)

## 2014-08-07 ENCOUNTER — Encounter: Payer: Self-pay | Admitting: Oncology

## 2014-08-07 DIAGNOSIS — C50912 Malignant neoplasm of unspecified site of left female breast: Secondary | ICD-10-CM | POA: Insufficient documentation

## 2014-08-07 NOTE — Progress Notes (Signed)
[place] @ [facility] [phone]  Fax:([phone]) [place]: 05-19-1950  MR#: [id number]  MPN#:[id number]  Patient Care Team: [place] Pink, MD as PCP - General (Family Medicine)  CHIEF COMPLAINT:  Chief Complaint  Patient presents with  . Follow-up    Breast Cancer    Oncology History   1. Carcinoma of left breast.  T2, N1, M0 tumor estrogen receptor and progesterone receptor weakly positive to and HER-2/neu receptor amplified by fish (diagnosis in April of 2015) by ultrasound-guided needle biopsy of the mass as well as of the lymph node 2. PET scan shows no evidence of distant metastases to disease 3. Patient is starting on B-52 protocol randomized to standard therapy with carboplatinum, Taxotere, Herceptin, perjeta starting May 10, 2013. 4. Patient has finished total 6 cycles of chemotherapy on August 23, 2013 now being referred for surgical intervention 5. Maintenance Herceptin starting from September 13, 2013 6. Patient to have mastectomy with reconstruction on 10/05/13. 7.patient decided against immediate reconstruction.  Had mastectomy done (left breast mastectomy) pT1 pNO stage I A tumor was found.  Tumor area    were  multifocal 8.patient is starting radiation therapy.  November, 2015.  And continuing maintenance therapywith Herceptin. 9.cellulitis of the left chest wall Infection with staph   aureus .  (November, 2015) 10patient is  also on letrozole 2.5 mg daily     Cancer of left breast   08/07/2014 Initial Diagnosis Cancer of left breast   64 year old lady with a history of carcinoma of breast T2 N1 M0 tumor status post neoadjuvant chemotherapy estrogen receptor progesterone receptor and HER-2 receptor positive.  Presently on letrozole INTERVAL HISTORY: Patient came today further follow-up.  Tolerating letrozole very well.  No bony pain.  Taking calcium and vitamin D.  No chills.  No fever.  REVIEW OF SYSTEMS:   GENERAL:  Feels good.   Active.  No fevers, sweats or weight loss. PERFORMANCE STATUS (ECOG):  0 HEENT:  No visual changes, runny nose, sore throat, mouth sores or tenderness. Lungs: No shortness of breath or cough.  No hemoptysis. Cardiac:  No chest pain, palpitations, orthopnea, or PND. GI:  No nausea, vomiting, diarrhea, constipation, melena or hematochezia. GU:  No urgency, frequency, dysuria, or hematuria. Musculoskeletal:  No back pain.  No joint pain.  No muscle tenderness. Extremities:  No pain or swelling. Skin:  No rashes or skin changes. Neuro:  No headache, numbness or weakness, balance or coordination issues. Endocrine:  No diabetes, thyroid issues, hot flashes or night sweats. Psych:  No mood changes, depression or anxiety. Pain:  No focal pain. Review of systems:  All other systems reviewed and found to be negative. As per HPI. Otherwise, a complete review of systems is negatve.  PAST MEDICAL HISTORY: Past Medical History  Diagnosis Date  . Breast cancer   . Diabetes mellitus without complication   . Depression   . Cataract   . Arthritis   . Hypothyroidism 07/09/2007  . Cancer of left breast 08/07/2014    PAST SURGICAL HISTORY: Past Surgical History  Procedure Laterality Date  . Mastectomy modified radical Left 10/05/2013  . Cataract extraction Bilateral 02/08/2013    FAMILY HISTORY Family History  Problem Relation Age of Onset  . Cancer Cousin   . Cancer Other     ADVANCED DIRECTIVES:  Patient does not have any living will or healthcare power of attorney.  Information was given .  Available resources had been discussed.  We  will follow-up on subsequent appointments regarding this issue HEALTH MAINTENANCE: History  Substance Use Topics  . Smoking status: Never Smoker   . Smokeless tobacco: Never Used  . Alcohol Use: No      Allergies  Allergen Reactions  . Demerol [Meperidine] Other (See Comments)    "Hyper"  . Penicillins Rash    Current Outpatient Prescriptions    Medication Sig Dispense Refill  . acetaminophen (TYLENOL) 500 MG tablet Take 1,000 mg by mouth every 6 (six) hours as needed (for pain).    . Biotin 10 MG TABS Take 1 tablet by mouth 3 (three) times daily.    . bisoprolol-hydrochlorothiazide (ZIAC) 5-6.25 MG per tablet Take 1 tablet by mouth at bedtime.    . Calcium Carb-Cholecalciferol 500-400 MG-UNIT TABS Take 1 tablet by mouth 2 (two) times daily.    . diphenhydramine-acetaminophen (TYLENOL PM) 25-500 MG TABS Take 1-2 tablets by mouth at bedtime as needed.    . FLUoxetine (PROZAC) 20 MG tablet Take 20 mg by mouth daily.    . glimepiride (AMARYL) 4 MG tablet Take 4 mg by mouth 2 (two) times daily.    . KLOR-CON M20 20 MEQ tablet TAKE 1 TABLET BY MOUTH ONCE A DAY 30 tablet 6  . letrozole (FEMARA) 2.5 MG tablet Take 2.5 mg by mouth daily.    . levothyroxine (SYNTHROID, LEVOTHROID) 175 MCG tablet Take 175 mcg by mouth daily before breakfast.    . loratadine (CLARITIN) 10 MG tablet Take 10 mg by mouth daily.    . magnesium chloride (SLOW-MAG) 64 MG TBEC SR tablet Take 1 tablet by mouth 2 (two) times daily.    . pioglitazone (ACTOS) 15 MG tablet Take 15 mg by mouth daily.    . simvastatin (ZOCOR) 20 MG tablet Take 20 mg by mouth at bedtime.    . sitaGLIPtin (JANUVIA) 100 MG tablet Take 100 mg by mouth daily.     No current facility-administered medications for this visit.    OBJECTIVE:  Filed Vitals:   08/01/14 1529  BP: 146/82  Pulse: 66  Temp: 97.5 F (36.4 C)  Resp: 20     Body mass index is 35.35 kg/(m^2).    ECOG FS:0 - Asymptomatic  PHYSICAL EXAM: GENERAL:  Well developed, well nourished, sitting comfortably in the exam room in no acute distress. MENTAL STATUS:  Alert and oriented to person, place and time. . ENT:  Oropharynx clear without lesion.  Tongue normal. Mucous membranes moist.  RESPIRATORY:  Clear to auscultation without rales, wheezes or rhonchi. CARDIOVASCULAR:  Regular rate and rhythm without murmur, rub or  gallop. BREAST:  Right breast without masses, skin changes or nipple discharge.  Breast: Chest wall area status post mastectomy no evidence of recurrent disease.  Grade 1 lymphedema. ABDOMEN:  Soft, non-tender, with active bowel sounds, and no hepatosplenomegaly.  No masses. BACK:  No CVA tenderness.  No tenderness on percussion of the back or rib cage. SKIN:  No rashes, ulcers or lesions. EXTREMITIES: No edema, no skin discoloration or tenderness.  No palpable cords. LYMPH NODES: No palpable cervical, supraclavicular, axillary or inguinal adenopathy  NEUROLOGICAL: Unremarkable. PSYCH:  Appropriate.   LAB RESULTS:  Infusion on 08/01/2014  Component Date Value Ref Range Status  . WBC 08/01/2014 6.8  3.6 - 11.0 K/uL Final   A-LINE DRAW  . RBC 08/01/2014 4.26  3.80 - 5.20 MIL/uL Final  . Hemoglobin 08/01/2014 11.7* 12.0 - 16.0 g/dL Final  . HCT 08/01/2014 35.8  35.0 - 47.0 %   Final  . MCV 08/01/2014 83.9  80.0 - 100.0 fL Final  . MCH 08/01/2014 27.5  26.0 - 34.0 pg Final  . MCHC 08/01/2014 32.8  32.0 - 36.0 g/dL Final  . RDW 08/01/2014 14.5  11.5 - 14.5 % Final  . Platelets 08/01/2014 284  150 - 440 K/uL Final  . Neutrophils Relative % 08/01/2014 73   Final  . Neutro Abs 08/01/2014 5.0  1.4 - 6.5 K/uL Final  . Lymphocytes Relative 08/01/2014 16   Final  . Lymphs Abs 08/01/2014 1.1  1.0 - 3.6 K/uL Final  . Monocytes Relative 08/01/2014 8   Final  . Monocytes Absolute 08/01/2014 0.6  0.2 - 0.9 K/uL Final  . Eosinophils Relative 08/01/2014 2   Final  . Eosinophils Absolute 08/01/2014 0.1  0 - 0.7 K/uL Final  . Basophils Relative 08/01/2014 1   Final  . Basophils Absolute 08/01/2014 0.0  0 - 0.1 K/uL Final  . Sodium 08/01/2014 135  135 - 145 mmol/L Final  . Potassium 08/01/2014 3.8  3.5 - 5.1 mmol/L Final  . Chloride 08/01/2014 103  101 - 111 mmol/L Final  . CO2 08/01/2014 27  22 - 32 mmol/L Final  . Glucose, Bld 08/01/2014 138* 65 - 99 mg/dL Final  . BUN 08/01/2014 18  6 - 20 mg/dL  Final  . Creatinine, Ser 08/01/2014 0.82  0.44 - 1.00 mg/dL Final  . Calcium 08/01/2014 8.7* 8.9 - 10.3 mg/dL Final  . Total Protein 08/01/2014 7.4  6.5 - 8.1 g/dL Final  . Albumin 08/01/2014 3.9  3.5 - 5.0 g/dL Final  . AST 08/01/2014 23  15 - 41 U/L Final  . ALT 08/01/2014 21  14 - 54 U/L Final  . Alkaline Phosphatase 08/01/2014 79  38 - 126 U/L Final  . Total Bilirubin 08/01/2014 0.4  0.3 - 1.2 mg/dL Final  . GFR calc non Af Amer 08/01/2014 >60  >60 mL/min Final  . GFR calc Af Amer 08/01/2014 >60  >60 mL/min Final   Comment: (NOTE) The eGFR has been calculated using the CKD EPI equation. This calculation has not been validated in all clinical situations. eGFR's persistently <60 mL/min signify possible Chronic Kidney Disease.   . Anion gap 08/01/2014 5  5 - 15 Final  . CA 27.29 08/01/2014 15.7  0.0 - 38.6 U/mL Final   Comment: (NOTE) [employer] Cent[facility]/ACS methodology Performed At: [facility] [place], [place] [id number] Lindon Romp MD YW:[id number]        ASSESSMENT: Carcinoma of the left breast.  Continue letrozole therapy all lab data has been reviewed.  Tumor markers has been reported to be within acceptable range MEDICAL DECISION MAKING:  As per protocol patient will get a MUGA scan of the heart in October. Regular mammograms On clinical ground there is no evidence of recurrent disease  Patient expressed understanding and was in agreement with this plan. She also understands that She can call clinic at any time with any questions, concerns, or complaints.    No matching staging information was found for the patient.  Forest Gleason, MD   08/07/2014 10:52 AM

## 2014-08-21 ENCOUNTER — Other Ambulatory Visit: Payer: Self-pay | Admitting: Oncology

## 2014-08-28 ENCOUNTER — Other Ambulatory Visit: Payer: Self-pay | Admitting: Oncology

## 2014-09-21 ENCOUNTER — Encounter: Payer: Self-pay | Admitting: Radiation Oncology

## 2014-09-21 ENCOUNTER — Inpatient Hospital Stay: Payer: BC Managed Care – PPO | Attending: Oncology

## 2014-09-21 ENCOUNTER — Ambulatory Visit
Admission: RE | Admit: 2014-09-21 | Discharge: 2014-09-21 | Disposition: A | Discharge status: Home / Self Care | Payer: BC Managed Care – PPO | Source: Ambulatory Visit | Attending: Radiation Oncology | Admitting: Radiation Oncology

## 2014-09-21 VITALS — BP 120/71 | HR 60 | Temp 97.1°F | Resp 20 | Wt 218.6 lb

## 2014-09-21 DIAGNOSIS — C50919 Malignant neoplasm of unspecified site of unspecified female breast: Secondary | ICD-10-CM

## 2014-09-21 DIAGNOSIS — C50912 Malignant neoplasm of unspecified site of left female breast: Secondary | ICD-10-CM | POA: Insufficient documentation

## 2014-09-21 DIAGNOSIS — Z9221 Personal history of antineoplastic chemotherapy: Secondary | ICD-10-CM | POA: Diagnosis not present

## 2014-09-21 DIAGNOSIS — Z9012 Acquired absence of left breast and nipple: Secondary | ICD-10-CM | POA: Diagnosis not present

## 2014-09-21 DIAGNOSIS — Z79811 Long term (current) use of aromatase inhibitors: Secondary | ICD-10-CM | POA: Insufficient documentation

## 2014-09-21 DIAGNOSIS — Z170 Estrogen receptor positive status [ER+]: Secondary | ICD-10-CM | POA: Diagnosis not present

## 2014-09-21 DIAGNOSIS — Z923 Personal history of irradiation: Secondary | ICD-10-CM | POA: Insufficient documentation

## 2014-09-21 LAB — CBC WITH DIFFERENTIAL/PLATELET
Basophils Absolute: 0 10*3/uL (ref 0–0.1)
Basophils Relative: 0 %
Eosinophils Absolute: 0.1 10*3/uL (ref 0–0.7)
Eosinophils Relative: 2 %
HEMATOCRIT: 35.9 % (ref 35.0–47.0)
HEMOGLOBIN: 12 g/dL (ref 12.0–16.0)
LYMPHS ABS: 1.1 10*3/uL (ref 1.0–3.6)
Lymphocytes Relative: 16 %
MCH: 28.1 pg (ref 26.0–34.0)
MCHC: 33.5 g/dL (ref 32.0–36.0)
MCV: 84 fL (ref 80.0–100.0)
MONOS PCT: 7 %
Monocytes Absolute: 0.5 10*3/uL (ref 0.2–0.9)
NEUTROS ABS: 5.4 10*3/uL (ref 1.4–6.5)
NEUTROS PCT: 75 %
Platelets: 272 10*3/uL (ref 150–440)
RBC: 4.28 MIL/uL (ref 3.80–5.20)
RDW: 14.5 % (ref 11.5–14.5)
WBC: 7.2 10*3/uL (ref 3.6–11.0)

## 2014-09-21 LAB — COMPREHENSIVE METABOLIC PANEL
ALBUMIN: 3.8 g/dL (ref 3.5–5.0)
ALK PHOS: 75 U/L (ref 38–126)
ALT: 19 U/L (ref 14–54)
ANION GAP: 5 (ref 5–15)
AST: 23 U/L (ref 15–41)
BILIRUBIN TOTAL: 0.4 mg/dL (ref 0.3–1.2)
BUN: 21 mg/dL — ABNORMAL HIGH (ref 6–20)
CALCIUM: 8.7 mg/dL — AB (ref 8.9–10.3)
CO2: 26 mmol/L (ref 22–32)
CREATININE: 0.8 mg/dL (ref 0.44–1.00)
Chloride: 104 mmol/L (ref 101–111)
GFR calc non Af Amer: >60 mL/min (ref >60)
GLUCOSE: 210 mg/dL — AB (ref 65–99)
Potassium: 3.9 mmol/L (ref 3.5–5.1)
Sodium: 135 mmol/L (ref 135–145)
TOTAL PROTEIN: 7.3 g/dL (ref 6.5–8.1)

## 2014-09-21 MED ORDER — HEPARIN SOD (PORK) LOCK FLUSH 100 UNIT/ML IV SOLN
500.0000 [IU] | Freq: Once | INTRAVENOUS | Status: AC
  Administered 2014-09-21: 500 [IU] via INTRAVENOUS

## 2014-09-21 MED ORDER — HEPARIN SOD (PORK) LOCK FLUSH 100 UNIT/ML IV SOLN
INTRAVENOUS | Status: AC
  Filled 2014-09-21: qty 5

## 2014-09-21 MED ORDER — SODIUM CHLORIDE 0.9 % IJ SOLN
10.0000 mL | INTRAMUSCULAR | Status: DC | PRN
  Administered 2014-09-21: 10 mL via INTRAVENOUS
  Filled 2014-09-21: qty 10

## 2014-09-21 NOTE — Progress Notes (Signed)
Radiation Oncology Follow up Note  Name: Mary Brady   Date:   09/21/2014 MRN:  [id number] DOB: 1950/11/11    This 64 y.o. female presents to the clinic today for follow-up for breast cancer.  REFERRING PROVIDER: No ref. provider found  HPI: Patient is a 64 year old female now seen out approximately 8 months having completed radiation therapy to her left chest wall and peripheral lymphatics for a T2 N1 M0 ER/PR positive HER-2/neu overexpressed invasive mammary carcinoma status post new adjuvant chemotherapy and left modified radical mastectomy and axillary node dissection. She is seen today in routine follow-up and is doing well. She has participated in NSABP protocol B-52 receiving Taxotere carboplatin and Herceptin and pertuzumab. She seen today in routine follow-up and is doing well. She specifically denies any chest wall pain or discomfort or any swelling in her left upper extremity. Unfortunately her partner expired recently from a heart attack.. She is currently on letrozole following that well without side effect.  COMPLICATIONS OF TREATMENT: none  FOLLOW UP COMPLIANCE: keeps appointments   PHYSICAL EXAM:  BP 120/71 mmHg  Pulse 60  Temp(Src) 97.1 F (36.2 C)  Resp 20  Wt 218 lb 9.4 oz (99.15 kg) Well-developed female in NAD right breast is free of dominant mass or nodularity in 2 positions examined. Left chest walls clear of mass or nodularity. No axillary or supraclavicular adenopathy is appreciated. No evidence of lymphedema of her left upper extremity is noted. Well-developed well-nourished patient in NAD. HEENT reveals PERLA, EOMI, discs not visualized.  Oral cavity is clear. No oral mucosal lesions are identified. Neck is clear without evidence of cervical or supraclavicular adenopathy. Lungs are clear to A&P. Cardiac examination is essentially unremarkable with regular rate and rhythm without murmur rub or thrill. Abdomen is benign with no organomegaly or masses noted. Motor  sensory and DTR levels are equal and symmetric in the upper and lower extremities. Cranial nerves II through XII are grossly intact. Proprioception is intact. No peripheral adenopathy or edema is identified. No motor or sensory levels are noted. Crude visual fields are within normal range.   RADIOLOGY RESULTS: Mammogram of the right breast in March was fine  PLAN: Present time she continues to do well with no evidence of disease. I have asked to see her back in 1 year for follow-up. She continues close follow-up care with medical oncology. She knows to call with any concerns. She continues on letrozole without side effect.  I would like to take this opportunity for allowing me to participate in the care of your patient.Armstead Peaks., MD

## 2014-09-22 LAB — CANCER ANTIGEN 27.29: CA 27.29: 23.5 U/mL (ref 0.0–38.6)

## 2014-10-27 ENCOUNTER — Ambulatory Visit
Admission: RE | Admit: 2014-10-27 | Discharge: 2014-10-27 | Disposition: A | Discharge status: Home / Self Care | Payer: BC Managed Care – PPO | Source: Ambulatory Visit | Attending: Oncology | Admitting: Oncology

## 2014-10-27 ENCOUNTER — Encounter: Admission: RE | Admit: 2014-10-27 | Payer: BC Managed Care – PPO | Source: Ambulatory Visit

## 2014-10-27 DIAGNOSIS — C50919 Malignant neoplasm of unspecified site of unspecified female breast: Secondary | ICD-10-CM | POA: Insufficient documentation

## 2014-10-27 MED ORDER — TECHNETIUM TC 99M-LABELED RED BLOOD CELLS IV KIT
25.0000 | PACK | Freq: Once | INTRAVENOUS | Status: AC | PRN
  Administered 2014-10-27: 21.706 via INTRAVENOUS

## 2014-10-28 ENCOUNTER — Other Ambulatory Visit: Payer: Self-pay | Admitting: *Deleted

## 2014-10-28 DIAGNOSIS — C50912 Malignant neoplasm of unspecified site of left female breast: Secondary | ICD-10-CM

## 2014-10-31 ENCOUNTER — Inpatient Hospital Stay (HOSPITAL_BASED_OUTPATIENT_CLINIC_OR_DEPARTMENT_OTHER): Payer: BC Managed Care – PPO | Admitting: Oncology

## 2014-10-31 ENCOUNTER — Inpatient Hospital Stay: Payer: BC Managed Care – PPO | Attending: Oncology

## 2014-10-31 ENCOUNTER — Encounter: Payer: Self-pay | Admitting: Oncology

## 2014-10-31 ENCOUNTER — Inpatient Hospital Stay: Payer: BC Managed Care – PPO

## 2014-10-31 VITALS — BP 134/86 | HR 68 | Temp 97.4°F | Wt 219.4 lb

## 2014-10-31 DIAGNOSIS — Z79811 Long term (current) use of aromatase inhibitors: Secondary | ICD-10-CM | POA: Insufficient documentation

## 2014-10-31 DIAGNOSIS — E119 Type 2 diabetes mellitus without complications: Secondary | ICD-10-CM | POA: Diagnosis not present

## 2014-10-31 DIAGNOSIS — C50912 Malignant neoplasm of unspecified site of left female breast: Secondary | ICD-10-CM | POA: Insufficient documentation

## 2014-10-31 DIAGNOSIS — Z23 Encounter for immunization: Secondary | ICD-10-CM | POA: Diagnosis not present

## 2014-10-31 DIAGNOSIS — E039 Hypothyroidism, unspecified: Secondary | ICD-10-CM | POA: Diagnosis not present

## 2014-10-31 DIAGNOSIS — F329 Major depressive disorder, single episode, unspecified: Secondary | ICD-10-CM | POA: Insufficient documentation

## 2014-10-31 DIAGNOSIS — Z79899 Other long term (current) drug therapy: Secondary | ICD-10-CM | POA: Insufficient documentation

## 2014-10-31 DIAGNOSIS — Z923 Personal history of irradiation: Secondary | ICD-10-CM

## 2014-10-31 DIAGNOSIS — Z9221 Personal history of antineoplastic chemotherapy: Secondary | ICD-10-CM

## 2014-10-31 DIAGNOSIS — Z9012 Acquired absence of left breast and nipple: Secondary | ICD-10-CM | POA: Insufficient documentation

## 2014-10-31 DIAGNOSIS — Z170 Estrogen receptor positive status [ER+]: Secondary | ICD-10-CM

## 2014-10-31 LAB — CBC WITH DIFFERENTIAL/PLATELET
BASOS PCT: 1 %
Basophils Absolute: 0 10*3/uL (ref 0–0.1)
EOS ABS: 0.1 10*3/uL (ref 0–0.7)
EOS PCT: 2 %
HCT: 36.1 % (ref 35.0–47.0)
Hemoglobin: 12.3 g/dL (ref 12.0–16.0)
LYMPHS ABS: 1.3 10*3/uL (ref 1.0–3.6)
Lymphocytes Relative: 19 %
MCH: 29.9 pg (ref 26.0–34.0)
MCHC: 34.2 g/dL (ref 32.0–36.0)
MCV: 87.6 fL (ref 80.0–100.0)
MONOS PCT: 7 %
Monocytes Absolute: 0.5 10*3/uL (ref 0.2–0.9)
Neutro Abs: 5 10*3/uL (ref 1.4–6.5)
Neutrophils Relative %: 71 %
PLATELETS: 302 10*3/uL (ref 150–440)
RBC: 4.12 MIL/uL (ref 3.80–5.20)
RDW: 14.3 % (ref 11.5–14.5)
WBC: 7 10*3/uL (ref 3.6–11.0)

## 2014-10-31 LAB — COMPREHENSIVE METABOLIC PANEL
ALK PHOS: 85 U/L (ref 38–126)
ALT: 18 U/L (ref 14–54)
ANION GAP: 5 (ref 5–15)
AST: 23 U/L (ref 15–41)
Albumin: 3.8 g/dL (ref 3.5–5.0)
BILIRUBIN TOTAL: 0.5 mg/dL (ref 0.3–1.2)
BUN: 20 mg/dL (ref 6–20)
CALCIUM: 8.8 mg/dL — AB (ref 8.9–10.3)
CO2: 28 mmol/L (ref 22–32)
Chloride: 101 mmol/L (ref 101–111)
Creatinine, Ser: 0.84 mg/dL (ref 0.44–1.00)
Glucose, Bld: 113 mg/dL — ABNORMAL HIGH (ref 65–99)
POTASSIUM: 3.5 mmol/L (ref 3.5–5.1)
Sodium: 134 mmol/L — ABNORMAL LOW (ref 135–145)
TOTAL PROTEIN: 7.4 g/dL (ref 6.5–8.1)

## 2014-10-31 LAB — MAGNESIUM: Magnesium: 1.8 mg/dL (ref 1.7–2.4)

## 2014-10-31 MED ORDER — SODIUM CHLORIDE 0.9 % IJ SOLN
10.0000 mL | INTRAMUSCULAR | Status: DC | PRN
  Administered 2014-10-31: 10 mL
  Filled 2014-10-31: qty 10

## 2014-10-31 MED ORDER — INFLUENZA VAC SPLIT QUAD 0.5 ML IM SUSY
0.5000 mL | PREFILLED_SYRINGE | Freq: Once | INTRAMUSCULAR | Status: AC
  Administered 2014-10-31: 0.5 mL via INTRAMUSCULAR
  Filled 2014-10-31: qty 0.5

## 2014-10-31 MED ORDER — HEPARIN SOD (PORK) LOCK FLUSH 100 UNIT/ML IV SOLN
500.0000 [IU] | Freq: Once | INTRAVENOUS | Status: AC
  Administered 2014-10-31: 500 [IU] via INTRAVENOUS
  Filled 2014-10-31: qty 5

## 2014-10-31 NOTE — Progress Notes (Signed)
[place] @ [facility] [phone]  Fax:([phone]) [place]: 08/05/1950  MR#: [id number]  WGY#:[id number]  Patient Care Team: Maryland Pink, MD as PCP - General (Family Medicine)  CHIEF COMPLAINT:  Chief Complaint  Patient presents with  . OTHER   Oncology History   1. Carcinoma of left breast.  T2, N1, M0 tumor estrogen receptor and progesterone receptor weakly positive to and HER-2/neu receptor amplified by fish (diagnosis in April of 2015) by ultrasound-guided needle biopsy of the mass as well as of the lymph node 2. PET scan shows no evidence of distant metastases to disease 3. Patient is starting on B-52 protocol randomized to standard therapy with carboplatinum, Taxotere, Herceptin, perjeta starting May 10, 2013. 4. Patient has finished total 6 cycles of chemotherapy on August 23, 2013 now being referred for surgical intervention 5. Maintenance Herceptin starting from September 13, 2013 6. Patient to have mastectomy with reconstruction on 10/05/13. 7.patient decided against immediate reconstruction.  Had mastectomy done (left breast mastectomy) pT1 pNO stage I A tumor was found.  Tumor area    were  multifocal 8.patient is starting radiation therapy.  November, 2015.  And continuing maintenance therapywith Herceptin. 9.cellulitis of the left chest wall Infection with staph   aureus .  (November, 2015) 10patient is  also on letrozole 2.5 mg daily   11.  has finished total 1 year of maintenance Herceptin therapy in April of 3620   64 year old lady with a history of carcinoma of breast T2 N1 M0 tumor status post neoadjuvant chemotherapy estrogen receptor progesterone receptor and HER-2 receptor positive.  Presently on letrozole INTERVAL HISTORY: 64 year old lady came today further follow-up regarding carcinoma breast.  Patient had MUGA scan of the heart which has been reviewed independently shows 53% ejection fraction She  somewhat emotional because of  her friends that with acute myocardial infarction.  Does not have any bony pain.  No nausea.  No vomiting.  Tolerating letrozole very well. Last mammogram was in March. REVIEW OF SYSTEMS:   GENERAL:  Feels good.  Active.  No fevers, sweats or weight loss. PERFORMANCE STATUS (ECOG):  0 HEENT:  No visual changes, runny nose, sore throat, mouth sores or tenderness. Lungs: No shortness of breath or cough.  No hemoptysis. Cardiac:  No chest pain, palpitations, orthopnea, or PND. GI:  No nausea, vomiting, diarrhea, constipation, melena or hematochezia. GU:  No urgency, frequency, dysuria, or hematuria. Musculoskeletal:  No back pain.  No joint pain.  No muscle tenderness. Extremities:  No pain or swelling. Skin:  No rashes or skin changes. Neuro:  No headache, numbness or weakness, balance or coordination issues. Endocrine:  No diabetes, thyroid issues, hot flashes or night sweats. Psych:  No mood changes, depression or anxiety. Pain:  No focal pain. Review of systems:  All other systems reviewed and found to be negative. As per HPI. Otherwise, a complete review of systems is negatve.  PAST MEDICAL HISTORY: Past Medical History  Diagnosis Date  . Breast cancer ([place])   . Diabetes mellitus without complication ([place])   . Depression   . Cataract   . Arthritis   . Hypothyroidism 07/09/2007  . Cancer of left breast ([place]) 08/07/2014    PAST SURGICAL HISTORY: Past Surgical History  Procedure Laterality Date  . Mastectomy modified radical Left 10/05/2013  . Cataract extraction Bilateral 02/08/2013    FAMILY HISTORY Family History  Problem Relation Age of Onset  . Cancer Cousin   . Cancer Other  ADVANCED DIRECTIVES:  Patient does not have any living will or healthcare power of attorney.  Information was given .  Available resources had been discussed.  We will follow-up on subsequent appointments regarding this issue HEALTH MAINTENANCE: Social History  Substance Use Topics  .  Smoking status: Never Smoker   . Smokeless tobacco: Never Used  . Alcohol Use: No      Allergies  Allergen Reactions  . Demerol [Meperidine] Other (See Comments)    "Hyper"  . Penicillins Rash    Current Outpatient Prescriptions  Medication Sig Dispense Refill  . acetaminophen (TYLENOL) 500 MG tablet Take 1,000 mg by mouth every 6 (six) hours as needed (for pain).    . Biotin 10 MG TABS Take 1 tablet by mouth 3 (three) times daily.    . bisoprolol-hydrochlorothiazide (ZIAC) 5-6.25 MG per tablet Take 1 tablet by mouth at bedtime.    . Calcium Carb-Cholecalciferol 500-400 MG-UNIT TABS Take 1 tablet by mouth 2 (two) times daily.    . diphenhydramine-acetaminophen (TYLENOL PM) 25-500 MG TABS Take 1-2 tablets by mouth at bedtime as needed.    Marland Kitchen FLUoxetine (PROZAC) 20 MG tablet Take 20 mg by mouth daily.    Marland Kitchen glimepiride (AMARYL) 4 MG tablet Take 4 mg by mouth 2 (two) times daily.    Marland Kitchen KLOR-CON M20 20 MEQ tablet TAKE 1 TABLET BY MOUTH ONCE A DAY 30 tablet 6  . letrozole (FEMARA) 2.5 MG tablet TAKE 1 TABLET BY MOUTH DAILY 30 tablet 5  . levothyroxine (SYNTHROID, LEVOTHROID) 150 MCG tablet Take by mouth.    . loratadine (CLARITIN) 10 MG tablet Take 10 mg by mouth daily.    . magnesium chloride (SLOW-MAG) 64 MG TBEC SR tablet Take 1 tablet by mouth 2 (two) times daily.    . pioglitazone (ACTOS) 15 MG tablet Take 15 mg by mouth daily.    . simvastatin (ZOCOR) 20 MG tablet Take 20 mg by mouth at bedtime.    . sitaGLIPtin (JANUVIA) 100 MG tablet Take 100 mg by mouth daily.    Marland Kitchen levothyroxine (SYNTHROID, LEVOTHROID) 175 MCG tablet Take 175 mcg by mouth daily before breakfast.     No current facility-administered medications for this visit.   Facility-Administered Medications Ordered in Other Visits  Medication Dose Route Frequency Provider Last Rate Last Dose  . sodium chloride 0.9 % injection 10 mL  10 mL Intracatheter PRN Forest Gleason, MD   10 mL at 10/31/14 1509    OBJECTIVE:  Filed  Vitals:   10/31/14 1551  BP: 134/86  Pulse: 68  Temp: 97.4 F (36.3 C)     Body mass index is 35.42 kg/(m^2).    ECOG FS:0 - Asymptomatic  PHYSICAL EXAM: GENERAL:  Well developed, well nourished, sitting comfortably in the exam room in no acute distress. MENTAL STATUS:  Alert and oriented to person, place and time. . ENT:  Oropharynx clear without lesion.  Tongue normal. Mucous membranes moist.  RESPIRATORY:  Clear to auscultation without rales, wheezes or rhonchi. CARDIOVASCULAR:  Regular rate and rhythm without murmur, rub or gallop. BREAST:  Right breast without masses, skin changes or nipple discharge.  Left Breast: Chest wall area status post mastectomy no evidence of recurrent disease.  Grade 1 lymphedema. ABDOMEN:  Soft, non-tender, with active bowel sounds, and no hepatosplenomegaly.  No masses. BACK:  No CVA tenderness.  No tenderness on percussion of the back or rib cage. SKIN:  No rashes, ulcers or lesions. EXTREMITIES: No edema, no skin discoloration  or tenderness.  No palpable cords. LYMPH NODES: No palpable cervical, supraclavicular, axillary or inguinal adenopathy  NEUROLOGICAL: Unremarkable. PSYCH:  Patient is somewhat depressed.   LAB RESULTS:  No visits with results within 2 Day(s) from this visit. Latest known visit with results is:  Infusion on 09/21/2014  Component Date Value Ref Range Status  . WBC 09/21/2014 7.2  3.6 - 11.0 K/uL Final   A-LINE DRAW  . RBC 09/21/2014 4.28  3.80 - 5.20 MIL/uL Final  . Hemoglobin 09/21/2014 12.0  12.0 - 16.0 g/dL Final  . HCT 09/21/2014 35.9  35.0 - 47.0 % Final  . MCV 09/21/2014 84.0  80.0 - 100.0 fL Final  . MCH 09/21/2014 28.1  26.0 - 34.0 pg Final  . MCHC 09/21/2014 33.5  32.0 - 36.0 g/dL Final  . RDW 09/21/2014 14.5  11.5 - 14.5 % Final  . Platelets 09/21/2014 272  150 - 440 K/uL Final  . Neutrophils Relative % 09/21/2014 75   Final  . Neutro Abs 09/21/2014 5.4  1.4 - 6.5 K/uL Final  . Lymphocytes Relative  09/21/2014 16   Final  . Lymphs Abs 09/21/2014 1.1  1.0 - 3.6 K/uL Final  . Monocytes Relative 09/21/2014 7   Final  . Monocytes Absolute 09/21/2014 0.5  0.2 - 0.9 K/uL Final  . Eosinophils Relative 09/21/2014 2   Final  . Eosinophils Absolute 09/21/2014 0.1  0 - 0.7 K/uL Final  . Basophils Relative 09/21/2014 0   Final  . Basophils Absolute 09/21/2014 0.0  0 - 0.1 K/uL Final  . Sodium 09/21/2014 135  135 - 145 mmol/L Final  . Potassium 09/21/2014 3.9  3.5 - 5.1 mmol/L Final  . Chloride 09/21/2014 104  101 - 111 mmol/L Final  . CO2 09/21/2014 26  22 - 32 mmol/L Final  . Glucose, Bld 09/21/2014 210* 65 - 99 mg/dL Final  . BUN 09/21/2014 21* 6 - 20 mg/dL Final  . Creatinine, Ser 09/21/2014 0.80  0.44 - 1.00 mg/dL Final  . Calcium 09/21/2014 8.7* 8.9 - 10.3 mg/dL Final  . Total Protein 09/21/2014 7.3  6.5 - 8.1 g/dL Final  . Albumin 09/21/2014 3.8  3.5 - 5.0 g/dL Final  . AST 09/21/2014 23  15 - 41 U/L Final  . ALT 09/21/2014 19  14 - 54 U/L Final  . Alkaline Phosphatase 09/21/2014 75  38 - 126 U/L Final  . Total Bilirubin 09/21/2014 0.4  0.3 - 1.2 mg/dL Final  . GFR calc non Af Amer 09/21/2014 >60  >60 mL/min Final  . GFR calc Af Amer 09/21/2014 >60  >60 mL/min Final   Comment: (NOTE) The eGFR has been calculated using the CKD EPI equation. This calculation has not been validated in all clinical situations. eGFR's persistently <60 mL/min signify possible Chronic Kidney Disease.   . Anion gap 09/21/2014 5  5 - 15 Final  . CA 27.29 09/21/2014 23.5  0.0 - 38.6 U/mL Final   Comment: (NOTE) Bayer Centaur/ACS methodology Performed At: [facility] [place], [place] [id number] Lindon Romp MD [id number]     Lab data white count is 7000.  Hemoglobin 12.3.  Potassium is 3.5.  Magnesium is 1.8.   ASSESSMENT: Carcinoma of the left breast.  Continue letrozole therapy all lab data has been reviewed.  Tumor markers has been reported to be within acceptable  range  We will continue potassium supplement. Magnesium supplement can be discontinued. Continue letrozole  MEDICAL DECISION MAKING:  As per protocol  patient will get a MUGA scan of the heart in October.  Was 53% ejection fraction which was normal Regular mammograms On clinical ground there is no evidence of recurrent disease Repeat mammogram in March.  Repeat bone density study in March of 2017 Continue letrozole Patient expressed understanding and was in agreement with this plan. She also understands that She can call clinic at any time with any questions, concerns, or complaints.    No matching staging information was found for the patient.  Forest Gleason, MD   10/31/2014 3:57 PM

## 2014-11-01 ENCOUNTER — Telehealth: Payer: Self-pay | Admitting: *Deleted

## 2014-11-01 LAB — CANCER ANTIGEN 27.29: CA 27.29: 15.2 U/mL (ref 0.0–38.6)

## 2014-11-01 NOTE — Telephone Encounter (Signed)
Called patient to let her know per MD to stop taking magnesium pills but to continue with potassium.  Verbalized understanding.

## 2014-11-01 NOTE — Telephone Encounter (Signed)
-----   Message from Forest Gleason, MD sent at 10/31/2014  4:41 PM EDT ----- Regarding: Potassium, magnesium Patient can discontinue magnesium supplement but will have to continue potassium pill

## 2014-11-30 ENCOUNTER — Encounter: Payer: Self-pay | Admitting: *Deleted

## 2014-12-01 ENCOUNTER — Ambulatory Visit: Payer: BC Managed Care – PPO | Admitting: Anesthesiology

## 2014-12-01 ENCOUNTER — Ambulatory Visit
Admission: RE | Admit: 2014-12-01 | Discharge: 2014-12-01 | Disposition: A | Discharge status: Home / Self Care | Payer: BC Managed Care – PPO | Source: Ambulatory Visit | Attending: Unknown Physician Specialty | Admitting: Unknown Physician Specialty

## 2014-12-01 ENCOUNTER — Encounter
Admission: RE | Disposition: A | Discharge status: Home / Self Care | Payer: Self-pay | Source: Ambulatory Visit | Attending: Unknown Physician Specialty

## 2014-12-01 ENCOUNTER — Encounter: Payer: Self-pay | Admitting: *Deleted

## 2014-12-01 DIAGNOSIS — Z86010 Personal history of colonic polyps: Secondary | ICD-10-CM | POA: Insufficient documentation

## 2014-12-01 DIAGNOSIS — K640 First degree hemorrhoids: Secondary | ICD-10-CM | POA: Diagnosis not present

## 2014-12-01 DIAGNOSIS — D123 Benign neoplasm of transverse colon: Secondary | ICD-10-CM | POA: Insufficient documentation

## 2014-12-01 DIAGNOSIS — I10 Essential (primary) hypertension: Secondary | ICD-10-CM | POA: Diagnosis not present

## 2014-12-01 DIAGNOSIS — Z853 Personal history of malignant neoplasm of breast: Secondary | ICD-10-CM | POA: Diagnosis not present

## 2014-12-01 DIAGNOSIS — Z79899 Other long term (current) drug therapy: Secondary | ICD-10-CM | POA: Insufficient documentation

## 2014-12-01 DIAGNOSIS — D120 Benign neoplasm of cecum: Secondary | ICD-10-CM | POA: Insufficient documentation

## 2014-12-01 DIAGNOSIS — Z880 Allergy status to penicillin: Secondary | ICD-10-CM | POA: Diagnosis not present

## 2014-12-01 DIAGNOSIS — E119 Type 2 diabetes mellitus without complications: Secondary | ICD-10-CM | POA: Diagnosis not present

## 2014-12-01 DIAGNOSIS — Z9842 Cataract extraction status, left eye: Secondary | ICD-10-CM | POA: Diagnosis not present

## 2014-12-01 DIAGNOSIS — Z9012 Acquired absence of left breast and nipple: Secondary | ICD-10-CM | POA: Diagnosis not present

## 2014-12-01 DIAGNOSIS — F329 Major depressive disorder, single episode, unspecified: Secondary | ICD-10-CM | POA: Insufficient documentation

## 2014-12-01 DIAGNOSIS — M1991 Primary osteoarthritis, unspecified site: Secondary | ICD-10-CM | POA: Diagnosis not present

## 2014-12-01 DIAGNOSIS — Z888 Allergy status to other drugs, medicaments and biological substances status: Secondary | ICD-10-CM | POA: Diagnosis not present

## 2014-12-01 DIAGNOSIS — Z9889 Other specified postprocedural states: Secondary | ICD-10-CM | POA: Insufficient documentation

## 2014-12-01 DIAGNOSIS — Z9841 Cataract extraction status, right eye: Secondary | ICD-10-CM | POA: Insufficient documentation

## 2014-12-01 DIAGNOSIS — E039 Hypothyroidism, unspecified: Secondary | ICD-10-CM | POA: Diagnosis not present

## 2014-12-01 DIAGNOSIS — K5730 Diverticulosis of large intestine without perforation or abscess without bleeding: Secondary | ICD-10-CM | POA: Insufficient documentation

## 2014-12-01 HISTORY — DX: Other specified abnormal uterine and vaginal bleeding: N93.8

## 2014-12-01 HISTORY — DX: Personal history of colon polyps, unspecified: Z86.0100

## 2014-12-01 HISTORY — DX: Essential (primary) hypertension: I10

## 2014-12-01 HISTORY — PX: COLONOSCOPY WITH PROPOFOL: SHX5780

## 2014-12-01 HISTORY — DX: Personal history of colonic polyps: Z86.010

## 2014-12-01 LAB — GLUCOSE, CAPILLARY: GLUCOSE-CAPILLARY: 206 mg/dL — AB (ref 65–99)

## 2014-12-01 SURGERY — COLONOSCOPY WITH PROPOFOL
Anesthesia: General

## 2014-12-01 MED ORDER — SODIUM CHLORIDE 0.9 % IV SOLN
INTRAVENOUS | Status: DC
  Administered 2014-12-01: 09:00:00 via INTRAVENOUS

## 2014-12-01 MED ORDER — SODIUM CHLORIDE 0.9 % IV SOLN: INTRAVENOUS | Status: DC

## 2014-12-01 MED ORDER — PROPOFOL 500 MG/50ML IV EMUL
INTRAVENOUS | Status: DC | PRN
  Administered 2014-12-01: 140 ug/kg/min via INTRAVENOUS

## 2014-12-01 MED ORDER — PROPOFOL 10 MG/ML IV BOLUS
INTRAVENOUS | Status: DC | PRN
  Administered 2014-12-01: 80 mg via INTRAVENOUS

## 2014-12-01 NOTE — Transfer of Care (Signed)
Immediate Anesthesia Transfer of Care Note  Patient: Mary Brady  Procedure(s) Performed: Procedure(s): COLONOSCOPY WITH PROPOFOL (N/A)  Patient Location: PACU  Anesthesia Type:General  Level of Consciousness: awake, alert  and oriented  Airway & Oxygen Therapy: Patient Spontanous Breathing and Patient connected to nasal cannula oxygen  Post-op Assessment: Report given to RN and Post -op Vital signs reviewed and stable  Post vital signs: Reviewed and stable  Last Vitals:  Filed Vitals:   12/01/14 0810  BP: 126/79  Pulse: 76  Temp: 36.3 C  Resp: 14    Complications: No apparent anesthesia complications

## 2014-12-01 NOTE — Anesthesia Postprocedure Evaluation (Signed)
  Anesthesia Post-op Note  Patient: Mary Brady  Procedure(s) Performed: Procedure(s): COLONOSCOPY WITH PROPOFOL (N/A)  Anesthesia type:General  Patient location: PACU  Post pain: Pain level controlled  Post assessment: Post-op Vital signs reviewed, Patient's Cardiovascular Status Stable, Respiratory Function Stable, Patent Airway and No signs of Nausea or vomiting  Post vital signs: Reviewed and stable  Last Vitals:  Filed Vitals:   12/01/14 1000  BP: 150/72  Pulse: 61  Temp:   Resp: 18    Level of consciousness: awake, alert  and patient cooperative  Complications: No apparent anesthesia complications

## 2014-12-01 NOTE — H&P (Signed)
Primary Care Physician:  Maryland Pink, MD Primary Gastroenterologist:  Dr. Vira Agar  Pre-Procedure History & Physical: HPI:  Mary Brady is a 64 y.o. female is here for an colonoscopy.   Past Medical History  Diagnosis Date  . Breast cancer ([place])   . Diabetes mellitus without complication ([place])   . Depression   . Cataract   . Arthritis   . Hypothyroidism 07/09/2007  . Cancer of left breast ([place]) 08/07/2014  . Dysfunctional uterine bleeding   . History of colonic polyps   . Hypertension     Past Surgical History  Procedure Laterality Date  . Mastectomy modified radical Left 10/05/2013  . Cataract extraction Bilateral 02/08/2013  . Endometrial biopsy    . Skin biopsy    . Colonoscopy      Prior to Admission medications   Medication Sig Start Date End Date Taking? Authorizing Provider  Biotin 10 MG TABS Take 1 tablet by mouth 3 (three) times daily. 03/10/14  Yes Historical Provider, MD  bisoprolol-hydrochlorothiazide (ZIAC) 5-6.25 MG per tablet Take 1 tablet by mouth at bedtime. 09/28/13  Yes Historical Provider, MD  Calcium Carb-Cholecalciferol 500-400 MG-UNIT TABS Take 1 tablet by mouth 2 (two) times daily. 04/20/13  Yes Historical Provider, MD  FLUoxetine (PROZAC) 20 MG tablet Take 20 mg by mouth daily. 04/20/13  Yes Historical Provider, MD  glimepiride (AMARYL) 4 MG tablet Take 4 mg by mouth 2 (two) times daily. 04/20/13  Yes Historical Provider, MD  KLOR-CON M20 20 MEQ tablet TAKE 1 TABLET BY MOUTH ONCE A DAY 05/27/14  Yes Forest Gleason, MD  letrozole ([place]) 2.5 MG tablet TAKE 1 TABLET BY MOUTH DAILY 08/29/14  Yes Forest Gleason, MD  levothyroxine (SYNTHROID, LEVOTHROID) 175 MCG tablet Take 175 mcg by mouth daily before breakfast. 04/20/13  Yes Historical Provider, MD  magnesium chloride (SLOW-MAG) 64 MG TBEC SR tablet Take 1 tablet by mouth 2 (two) times daily.   Yes Historical Provider, MD  pioglitazone (ACTOS) 15 MG tablet Take 15 mg by mouth daily. 04/20/13  Yes Historical  Provider, MD  simvastatin (ZOCOR) 20 MG tablet Take 20 mg by mouth at bedtime. 04/20/13  Yes Historical Provider, MD  sitaGLIPtin (JANUVIA) 100 MG tablet Take 100 mg by mouth daily. 04/20/13  Yes Historical Provider, MD  acetaminophen (TYLENOL) 500 MG tablet Take 1,000 mg by mouth every 6 (six) hours as needed (for pain). 09/28/13   Historical Provider, MD  diphenhydramine-acetaminophen (TYLENOL PM) 25-500 MG TABS Take 1-2 tablets by mouth at bedtime as needed.    Historical Provider, MD  levothyroxine (SYNTHROID, LEVOTHROID) 150 MCG tablet Take by mouth. 09/29/14 09/29/15  Historical Provider, MD  loratadine (CLARITIN) 10 MG tablet Take 10 mg by mouth daily. 04/20/13   Historical Provider, MD    Allergies as of 10/24/2014 - Review Complete 09/21/2014  Allergen Reaction Noted  . Demerol [meperidine] Other (See Comments) 05/23/1997  . Penicillins Rash 05/23/1997    Family History  Problem Relation Age of Onset  . Cancer Cousin   . Cancer Other     Social History   Social History  . Marital Status: Single    Spouse Name: N/A  . Number of Children: N/A  . Years of Education: N/A   Occupational History  . Not on file.   Social History Main Topics  . Smoking status: Never Smoker   . Smokeless tobacco: Never Used  . Alcohol Use: No  . Drug Use: No  . Sexual Activity: Not on file  Other Topics Concern  . Not on file   Social History Narrative    Review of Systems: See HPI, otherwise negative ROS  Physical Exam: BP 126/79 mmHg  Pulse 76  Temp(Src) 97.4 F (36.3 C) (Tympanic)  Resp 14  Ht 5\' 6"  (1.676 m)  Wt 102.059 kg (225 lb)  BMI 36.33 kg/m2  SpO2 99% General:   Alert,  pleasant and cooperative in NAD Head:  Normocephalic and atraumatic. Neck:  Supple; no masses or thyromegaly. Lungs:  Clear throughout to auscultation.    Heart:  Regular rate and rhythm. Abdomen:  Soft, nontender and nondistended. Normal bowel sounds, without guarding, and without rebound.    Neurologic:  Alert and  oriented x4;  grossly normal neurologically.  Impression/Plan: [name] is here for an colonoscopy to be performed for [facility] colon polyps  Risks, benefits, limitations, and alternatives regarding  colonoscopy have been reviewed with the patient.  Questions have been answered.  All parties agreeable.   Gaylyn Cheers, MD  12/01/2014, 9:01 AM

## 2014-12-01 NOTE — Op Note (Signed)
[facility] Gastroenterology Patient Name: Mary Brady Procedure Date: 12/01/2014 8:54 AM MRN: [id number] Account #: [id number] Date of Birth: 1950/09/04 Admit Type: Outpatient Age: 64 Room: [facility] ENDO ROOM 1 Gender: Female Note Status: Finalized Procedure:         Colonoscopy Indications:       High risk colon cancer surveillance: Personal history of                     colonic polyps Providers:         Manya Silvas, MD Referring MD:      Irven Easterly. Kary Kos, MD (Referring MD) Medicines:         Propofol per Anesthesia Complications:     No immediate complications. Procedure:         Pre-Anesthesia Assessment:                    - After reviewing the risks and benefits, the patient was                     deemed in satisfactory condition to undergo the procedure.                    After obtaining informed consent, the colonoscope was                     passed under direct vision. Throughout the procedure, the                     patient's blood pressure, pulse, and oxygen saturations                     were monitored continuously. The Colonoscope was                     introduced through the anus and advanced to the the cecum,                     identified by appendiceal orifice and ileocecal valve. The                     colonoscopy was performed without difficulty. The patient                     tolerated the procedure well. The quality of the bowel                     preparation was good. Findings:      A diminutive polyp was found in the cecum. The polyp was sessile. The       polyp was removed with a jumbo cold forceps. Resection and retrieval       were complete.      A small polyp was found at the hepatic flexure. The polyp was sessile.       The polyp was removed with a hot snare. Resection and retrieval were       complete.      Multiple small and large-mouthed diverticula were found in the sigmoid       colon, in the descending colon, in  the transverse colon and in the       ascending colon.      Internal hemorrhoids were found during endoscopy. The hemorrhoids were       small and Grade I (internal hemorrhoids that do  not prolapse).      The exam was otherwise without abnormality. Impression:        - One diminutive polyp in the cecum. Resected and                     retrieved.                    - One small polyp at the hepatic flexure. Resected and                     retrieved.                    - Diverticulosis in the sigmoid colon, in the descending                     colon, in the transverse colon and in the ascending colon.                    - Internal hemorrhoids.                    - The examination was otherwise normal. Recommendation:    - Await pathology results. Manya Silvas, MD 12/01/2014 9:24:35 AM This report has been signed electronically. Number of Addenda: 0 Note Initiated On: 12/01/2014 8:54 AM Scope Withdrawal Time: 0 hours 12 minutes 0 seconds  Total Procedure Duration: 0 hours 15 minutes 45 seconds       [place]

## 2014-12-01 NOTE — Anesthesia Preprocedure Evaluation (Signed)
Anesthesia Evaluation  Patient identified by MRN, date of birth, ID band Patient awake    Reviewed: Allergy & Precautions, H&P , NPO status , Patient's Chart, lab work & pertinent test results, reviewed documented beta blocker date and time   History of Anesthesia Complications Negative for: history of anesthetic complications  Airway Mallampati: II  TM Distance: >3 FB Neck ROM: full    Dental no notable dental hx. (+) Teeth Intact   Pulmonary neg pulmonary ROS,    Pulmonary exam normal breath sounds clear to auscultation       Cardiovascular Exercise Tolerance: Good hypertension, (-) angina(-) CAD, (-) Past MI, (-) Cardiac Stents and (-) CABG Normal cardiovascular exam(-) dysrhythmias (-) Valvular Problems/Murmurs Rhythm:regular Rate:Normal     Neuro/Psych PSYCHIATRIC DISORDERS (depression) negative neurological ROS     GI/Hepatic negative GI ROS, Neg liver ROS,   Endo/Other  diabetes, Oral Hypoglycemic AgentsHypothyroidism Morbid obesity  Renal/GU negative Renal ROS  negative genitourinary   Musculoskeletal   Abdominal   Peds  Hematology negative hematology ROS (+)   Anesthesia Other Findings Past Medical History:   Breast cancer ([place])                                          Diabetes mellitus without complication ([place])                 Depression                                                   Cataract                                                     Arthritis                                                    Hypothyroidism                                  07/09/2007   Cancer of left breast ([place])                     08/07/2014    Dysfunctional uterine bleeding                               History of colonic polyps                                    Hypertension                                                 Reproductive/Obstetrics negative  OB ROS                              Anesthesia Physical Anesthesia Plan  ASA: III  Anesthesia Plan: General   Post-op Pain Management:    Induction:   Airway Management Planned:   Additional Equipment:   Intra-op Plan:   Post-operative Plan:   Informed Consent: I have reviewed the patients History and Physical, chart, labs and discussed the procedure including the risks, benefits and alternatives for the proposed anesthesia with the patient or authorized representative who has indicated his/her understanding and acceptance.   Dental Advisory Given  Plan Discussed with: Anesthesiologist, CRNA and Surgeon  Anesthesia Plan Comments:         Anesthesia Quick Evaluation

## 2014-12-02 ENCOUNTER — Encounter: Payer: Self-pay | Admitting: Unknown Physician Specialty

## 2014-12-02 LAB — SURGICAL PATHOLOGY

## 2014-12-12 ENCOUNTER — Inpatient Hospital Stay: Payer: PRIVATE HEALTH INSURANCE | Attending: Oncology

## 2015-01-02 ENCOUNTER — Inpatient Hospital Stay: Payer: BC Managed Care – PPO | Attending: Oncology

## 2015-01-02 DIAGNOSIS — Z9012 Acquired absence of left breast and nipple: Secondary | ICD-10-CM | POA: Insufficient documentation

## 2015-01-02 DIAGNOSIS — Z9221 Personal history of antineoplastic chemotherapy: Secondary | ICD-10-CM | POA: Insufficient documentation

## 2015-01-02 DIAGNOSIS — Z79811 Long term (current) use of aromatase inhibitors: Secondary | ICD-10-CM | POA: Diagnosis not present

## 2015-01-02 DIAGNOSIS — C50912 Malignant neoplasm of unspecified site of left female breast: Secondary | ICD-10-CM | POA: Insufficient documentation

## 2015-01-02 DIAGNOSIS — Z170 Estrogen receptor positive status [ER+]: Secondary | ICD-10-CM | POA: Diagnosis not present

## 2015-01-02 DIAGNOSIS — Z452 Encounter for adjustment and management of vascular access device: Secondary | ICD-10-CM | POA: Insufficient documentation

## 2015-01-02 DIAGNOSIS — Z923 Personal history of irradiation: Secondary | ICD-10-CM | POA: Diagnosis not present

## 2015-01-02 DIAGNOSIS — C801 Malignant (primary) neoplasm, unspecified: Secondary | ICD-10-CM

## 2015-01-02 MED ORDER — HEPARIN SOD (PORK) LOCK FLUSH 100 UNIT/ML IV SOLN
500.0000 [IU] | Freq: Once | INTRAVENOUS | Status: AC
Start: 2015-01-02 — End: 2015-01-02
  Administered 2015-01-02: 500 [IU] via INTRAVENOUS
  Filled 2015-01-02: qty 5

## 2015-01-02 MED ORDER — SODIUM CHLORIDE 0.9 % IJ SOLN
10.0000 mL | INTRAMUSCULAR | Status: DC | PRN
  Administered 2015-01-02: 10 mL via INTRAVENOUS
  Filled 2015-01-02: qty 10

## 2015-01-23 ENCOUNTER — Inpatient Hospital Stay: Payer: BC Managed Care – PPO

## 2015-02-13 ENCOUNTER — Inpatient Hospital Stay: Payer: BC Managed Care – PPO | Attending: Oncology

## 2015-02-13 DIAGNOSIS — Z9012 Acquired absence of left breast and nipple: Secondary | ICD-10-CM | POA: Insufficient documentation

## 2015-02-13 DIAGNOSIS — Z170 Estrogen receptor positive status [ER+]: Secondary | ICD-10-CM | POA: Diagnosis not present

## 2015-02-13 DIAGNOSIS — C50912 Malignant neoplasm of unspecified site of left female breast: Secondary | ICD-10-CM | POA: Diagnosis not present

## 2015-02-13 DIAGNOSIS — C801 Malignant (primary) neoplasm, unspecified: Secondary | ICD-10-CM

## 2015-02-13 DIAGNOSIS — Z452 Encounter for adjustment and management of vascular access device: Secondary | ICD-10-CM | POA: Insufficient documentation

## 2015-02-13 DIAGNOSIS — Z923 Personal history of irradiation: Secondary | ICD-10-CM | POA: Insufficient documentation

## 2015-02-13 DIAGNOSIS — Z9221 Personal history of antineoplastic chemotherapy: Secondary | ICD-10-CM | POA: Insufficient documentation

## 2015-02-13 DIAGNOSIS — Z79811 Long term (current) use of aromatase inhibitors: Secondary | ICD-10-CM | POA: Insufficient documentation

## 2015-02-13 MED ORDER — SODIUM CHLORIDE 0.9% FLUSH
10.0000 mL | INTRAVENOUS | Status: DC | PRN
Start: 2015-02-13 — End: 2015-02-13
  Administered 2015-02-13: 10 mL via INTRAVENOUS
  Filled 2015-02-13: qty 10

## 2015-02-13 MED ORDER — HEPARIN SOD (PORK) LOCK FLUSH 100 UNIT/ML IV SOLN
500.0000 [IU] | Freq: Once | INTRAVENOUS | Status: AC
  Administered 2015-02-13: 500 [IU] via INTRAVENOUS
  Filled 2015-02-13: qty 5

## 2015-03-27 ENCOUNTER — Inpatient Hospital Stay: Payer: BC Managed Care – PPO | Attending: Oncology

## 2015-03-27 DIAGNOSIS — Z9221 Personal history of antineoplastic chemotherapy: Secondary | ICD-10-CM | POA: Insufficient documentation

## 2015-03-27 DIAGNOSIS — Z923 Personal history of irradiation: Secondary | ICD-10-CM | POA: Insufficient documentation

## 2015-03-27 DIAGNOSIS — Z452 Encounter for adjustment and management of vascular access device: Secondary | ICD-10-CM | POA: Insufficient documentation

## 2015-03-27 DIAGNOSIS — Z9012 Acquired absence of left breast and nipple: Secondary | ICD-10-CM | POA: Insufficient documentation

## 2015-03-27 DIAGNOSIS — Z170 Estrogen receptor positive status [ER+]: Secondary | ICD-10-CM | POA: Insufficient documentation

## 2015-03-27 DIAGNOSIS — Z79811 Long term (current) use of aromatase inhibitors: Secondary | ICD-10-CM | POA: Insufficient documentation

## 2015-03-27 DIAGNOSIS — C50912 Malignant neoplasm of unspecified site of left female breast: Secondary | ICD-10-CM | POA: Insufficient documentation

## 2015-04-05 ENCOUNTER — Inpatient Hospital Stay: Payer: BC Managed Care – PPO

## 2015-04-05 DIAGNOSIS — Z452 Encounter for adjustment and management of vascular access device: Secondary | ICD-10-CM | POA: Diagnosis not present

## 2015-04-05 DIAGNOSIS — Z923 Personal history of irradiation: Secondary | ICD-10-CM | POA: Diagnosis not present

## 2015-04-05 DIAGNOSIS — C50912 Malignant neoplasm of unspecified site of left female breast: Secondary | ICD-10-CM | POA: Diagnosis not present

## 2015-04-05 DIAGNOSIS — Z170 Estrogen receptor positive status [ER+]: Secondary | ICD-10-CM | POA: Diagnosis not present

## 2015-04-05 DIAGNOSIS — Z9012 Acquired absence of left breast and nipple: Secondary | ICD-10-CM | POA: Diagnosis not present

## 2015-04-05 DIAGNOSIS — Z95828 Presence of other vascular implants and grafts: Secondary | ICD-10-CM

## 2015-04-05 DIAGNOSIS — Z9221 Personal history of antineoplastic chemotherapy: Secondary | ICD-10-CM | POA: Diagnosis not present

## 2015-04-05 DIAGNOSIS — Z79811 Long term (current) use of aromatase inhibitors: Secondary | ICD-10-CM | POA: Diagnosis not present

## 2015-04-05 MED ORDER — HEPARIN SOD (PORK) LOCK FLUSH 100 UNIT/ML IV SOLN
500.0000 [IU] | Freq: Once | INTRAVENOUS | Status: AC
  Administered 2015-04-05: 500 [IU] via INTRAVENOUS

## 2015-04-05 MED ORDER — SODIUM CHLORIDE 0.9% FLUSH
10.0000 mL | INTRAVENOUS | Status: DC | PRN
  Administered 2015-04-05: 10 mL via INTRAVENOUS
  Filled 2015-04-05: qty 10

## 2015-04-14 ENCOUNTER — Other Ambulatory Visit: Payer: Self-pay | Admitting: Oncology

## 2015-04-17 ENCOUNTER — Other Ambulatory Visit: Payer: Self-pay | Admitting: *Deleted

## 2015-04-17 ENCOUNTER — Ambulatory Visit
Admission: RE | Admit: 2015-04-17 | Discharge: 2015-04-17 | Disposition: A | Discharge status: Home / Self Care | Payer: BC Managed Care – PPO | Source: Ambulatory Visit | Attending: Oncology | Admitting: Oncology

## 2015-04-17 DIAGNOSIS — Z1231 Encounter for screening mammogram for malignant neoplasm of breast: Secondary | ICD-10-CM | POA: Insufficient documentation

## 2015-04-17 DIAGNOSIS — R920 Mammographic microcalcification found on diagnostic imaging of breast: Secondary | ICD-10-CM

## 2015-04-17 DIAGNOSIS — C50912 Malignant neoplasm of unspecified site of left female breast: Secondary | ICD-10-CM

## 2015-04-24 ENCOUNTER — Ambulatory Visit
Admission: RE | Admit: 2015-04-24 | Discharge: 2015-04-24 | Disposition: A | Discharge status: Home / Self Care | Payer: BC Managed Care – PPO | Source: Ambulatory Visit | Attending: Oncology | Admitting: Oncology

## 2015-04-24 ENCOUNTER — Other Ambulatory Visit: Payer: PRIVATE HEALTH INSURANCE

## 2015-04-24 ENCOUNTER — Other Ambulatory Visit: Payer: Self-pay | Admitting: *Deleted

## 2015-04-24 ENCOUNTER — Ambulatory Visit: Payer: PRIVATE HEALTH INSURANCE | Admitting: Oncology

## 2015-04-24 DIAGNOSIS — R920 Mammographic microcalcification found on diagnostic imaging of breast: Secondary | ICD-10-CM | POA: Diagnosis not present

## 2015-05-04 ENCOUNTER — Inpatient Hospital Stay: Payer: BC Managed Care – PPO | Attending: Oncology

## 2015-05-04 ENCOUNTER — Inpatient Hospital Stay (HOSPITAL_BASED_OUTPATIENT_CLINIC_OR_DEPARTMENT_OTHER): Payer: BC Managed Care – PPO | Admitting: Oncology

## 2015-05-04 ENCOUNTER — Inpatient Hospital Stay: Payer: BC Managed Care – PPO

## 2015-05-04 VITALS — BP 123/82 | HR 72 | Temp 97.5°F | Resp 18 | Wt 227.1 lb

## 2015-05-04 DIAGNOSIS — Z923 Personal history of irradiation: Secondary | ICD-10-CM | POA: Insufficient documentation

## 2015-05-04 DIAGNOSIS — C801 Malignant (primary) neoplasm, unspecified: Secondary | ICD-10-CM

## 2015-05-04 DIAGNOSIS — M1990 Unspecified osteoarthritis, unspecified site: Secondary | ICD-10-CM | POA: Insufficient documentation

## 2015-05-04 DIAGNOSIS — Z9221 Personal history of antineoplastic chemotherapy: Secondary | ICD-10-CM

## 2015-05-04 DIAGNOSIS — Z9012 Acquired absence of left breast and nipple: Secondary | ICD-10-CM | POA: Insufficient documentation

## 2015-05-04 DIAGNOSIS — I10 Essential (primary) hypertension: Secondary | ICD-10-CM | POA: Diagnosis not present

## 2015-05-04 DIAGNOSIS — R921 Mammographic calcification found on diagnostic imaging of breast: Secondary | ICD-10-CM | POA: Insufficient documentation

## 2015-05-04 DIAGNOSIS — Z79899 Other long term (current) drug therapy: Secondary | ICD-10-CM | POA: Insufficient documentation

## 2015-05-04 DIAGNOSIS — E039 Hypothyroidism, unspecified: Secondary | ICD-10-CM | POA: Insufficient documentation

## 2015-05-04 DIAGNOSIS — Z79811 Long term (current) use of aromatase inhibitors: Secondary | ICD-10-CM

## 2015-05-04 DIAGNOSIS — E119 Type 2 diabetes mellitus without complications: Secondary | ICD-10-CM | POA: Insufficient documentation

## 2015-05-04 DIAGNOSIS — C50912 Malignant neoplasm of unspecified site of left female breast: Secondary | ICD-10-CM | POA: Diagnosis not present

## 2015-05-04 DIAGNOSIS — Z170 Estrogen receptor positive status [ER+]: Secondary | ICD-10-CM | POA: Diagnosis not present

## 2015-05-04 LAB — COMPREHENSIVE METABOLIC PANEL
ALBUMIN: 3.9 g/dL (ref 3.5–5.0)
ALK PHOS: 90 U/L (ref 38–126)
ALT: 26 U/L (ref 14–54)
ANION GAP: 3 — AB (ref 5–15)
AST: 32 U/L (ref 15–41)
BILIRUBIN TOTAL: 0.6 mg/dL (ref 0.3–1.2)
BUN: 21 mg/dL — AB (ref 6–20)
CALCIUM: 8.6 mg/dL — AB (ref 8.9–10.3)
CO2: 25 mmol/L (ref 22–32)
Chloride: 104 mmol/L (ref 101–111)
Creatinine, Ser: 0.91 mg/dL (ref 0.44–1.00)
GFR calc Af Amer: >60 mL/min (ref >60)
GFR calc non Af Amer: >60 mL/min (ref >60)
GLUCOSE: 323 mg/dL — AB (ref 65–99)
Potassium: 3.9 mmol/L (ref 3.5–5.1)
SODIUM: 132 mmol/L — AB (ref 135–145)
TOTAL PROTEIN: 7.3 g/dL (ref 6.5–8.1)

## 2015-05-04 LAB — CBC WITH DIFFERENTIAL/PLATELET
BASOS ABS: 0 10*3/uL (ref 0–0.1)
BASOS PCT: 0 %
EOS ABS: 0.1 10*3/uL (ref 0–0.7)
Eosinophils Relative: 1 %
HEMATOCRIT: 35.9 % (ref 35.0–47.0)
HEMOGLOBIN: 12.1 g/dL (ref 12.0–16.0)
Lymphocytes Relative: 11 %
Lymphs Abs: 1 10*3/uL (ref 1.0–3.6)
MCH: 29.2 pg (ref 26.0–34.0)
MCHC: 33.7 g/dL (ref 32.0–36.0)
MCV: 86.5 fL (ref 80.0–100.0)
MONOS PCT: 5 %
Monocytes Absolute: 0.5 10*3/uL (ref 0.2–0.9)
NEUTROS ABS: 8.1 10*3/uL — AB (ref 1.4–6.5)
NEUTROS PCT: 83 %
Platelets: 283 10*3/uL (ref 150–440)
RBC: 4.14 MIL/uL (ref 3.80–5.20)
RDW: 14.1 % (ref 11.5–14.5)
WBC: 9.8 10*3/uL (ref 3.6–11.0)

## 2015-05-04 LAB — MAGNESIUM: Magnesium: 1.8 mg/dL (ref 1.7–2.4)

## 2015-05-04 MED ORDER — SODIUM CHLORIDE 0.9% FLUSH
10.0000 mL | INTRAVENOUS | Status: DC | PRN
  Administered 2015-05-04: 10 mL via INTRAVENOUS
  Filled 2015-05-04: qty 10

## 2015-05-04 MED ORDER — HEPARIN SOD (PORK) LOCK FLUSH 100 UNIT/ML IV SOLN
500.0000 [IU] | Freq: Once | INTRAVENOUS | Status: AC
  Administered 2015-05-04: 500 [IU] via INTRAVENOUS

## 2015-05-06 ENCOUNTER — Encounter: Payer: Self-pay | Admitting: Oncology

## 2015-05-06 NOTE — Progress Notes (Signed)
[place] @ [facility] [phone]  Fax:([phone]) [place]: 1950-03-31  MR#: [id number]  GYJ#:[id number]  Patient Care Team: Maryland Pink, MD as PCP - General (Family Medicine)  CHIEF COMPLAINT:  Chief Complaint  Patient presents with  . Breast Cancer   Oncology History   1. Carcinoma of left breast.  T2, N1, M0 tumor estrogen receptor and progesterone receptor weakly positive to and HER-2/neu receptor amplified by fish (diagnosis in April of 2015) by ultrasound-guided needle biopsy of the mass as well as of the lymph node 2. PET scan shows no evidence of distant metastases to disease 3. Patient is starting on B-52 protocol randomized to standard therapy with carboplatinum, Taxotere, Herceptin, perjeta starting May 10, 2013. 4. Patient has finished total 6 cycles of chemotherapy on August 23, 2013 now being referred for surgical intervention 5. Maintenance Herceptin starting from September 13, 2013 6. Patient to have mastectomy with reconstruction on 10/05/13. 7.patient decided against immediate reconstruction.  Had mastectomy done (left breast mastectomy) pT1 pNO stage I A tumor was found.  Tumor area    were  multifocal 8.patient is starting radiation therapy.  November, 2015.  And continuing maintenance therapywith Herceptin. 9.cellulitis of the left chest wall Infection with staph   aureus .  (November, 2015) 10patient is  also on letrozole 2.5 mg daily   11.  has finished total 1 year of maintenance Herceptin therapy in April of 4063   65 year old lady with a history of carcinoma of breast T2 N1 M0 tumor status post neoadjuvant chemotherapy estrogen receptor progesterone receptor and HER-2 receptor positive.  Presently on letrozole  She is tolerating letrozole very well.  Eugenio Hoes has resolved.  No nausea.  No bony pains. INTERVAL HISTORY: 65 year old lady came today further follow-up regarding carcinoma breast.  Patient had MUGA scan of the heart  which has been reviewed independently shows 53% ejection fraction She  somewhat emotional because of her friends that with acute myocardial infarction.  Does not have any bony pain.  No nausea.  No vomiting.  Tolerating letrozole very well. Patient had screening mammogram in because of abnormality diagnostic mammogram has been ordered.  Right breast calcification was found probably benign possibility of biopsy versus follow-up in 6 months with mammogram has been recommended. Last mammogram was in March. REVIEW OF SYSTEMS:   GENERAL:  Feels good.  Active.  No fevers, sweats or weight loss. PERFORMANCE STATUS (ECOG):  0 HEENT:  No visual changes, runny nose, sore throat, mouth sores or tenderness. Lungs: No shortness of breath or cough.  No hemoptysis. Cardiac:  No chest pain, palpitations, orthopnea, or PND. GI:  No nausea, vomiting, diarrhea, constipation, melena or hematochezia. GU:  No urgency, frequency, dysuria, or hematuria. Musculoskeletal:  No back pain.  No joint pain.  No muscle tenderness. Extremities:  No pain or swelling. Skin:  No rashes or skin changes. Neuro:  No headache, numbness or weakness, balance or coordination issues. Endocrine:  No diabetes, thyroid issues, hot flashes or night sweats. Psych:  No mood changes, depression or anxiety. Pain:  No focal pain. Review of systems:  All other systems reviewed and found to be negative. As per HPI. Otherwise, a complete review of systems is negatve.  PAST MEDICAL HISTORY: Past Medical History  Diagnosis Date  . Diabetes mellitus without complication ([place])   . Depression   . Cataract   . Arthritis   . Hypothyroidism 07/09/2007  . Cancer of left breast ([place]) 08/07/2014  .  Dysfunctional uterine bleeding   . History of colonic polyps   . Hypertension   . Breast cancer ([place])     left breast cancer, chemo, radiation    PAST SURGICAL HISTORY: Past Surgical History  Procedure Laterality Date  . Cataract extraction  Bilateral 02/08/2013  . Endometrial biopsy    . Skin biopsy    . Colonoscopy    . Colonoscopy with propofol N/A 12/01/2014    Procedure: COLONOSCOPY WITH PROPOFOL;  Surgeon: Manya Silvas, MD;  Location[facility]COPY;  Service: Endoscopy;  Laterality: N/A;  . Breast biopsy Left 03/2013    positive  . Mastectomy modified radical Left 10/05/2013    FAMILY HISTORY Family History  Problem Relation Age of Onset  . Cancer Cousin   . Cancer Other     ADVANCED DIRECTIVES:  Patient does not have any living will or healthcare power of attorney.  Information was given .  Available resources had been discussed.  We will follow-up on subsequent appointments regarding this issue HEALTH MAINTENANCE: Social History  Substance Use Topics  . Smoking status: Never Smoker   . Smokeless tobacco: Never Used  . Alcohol Use: No      Allergies  Allergen Reactions  . Demerol [Meperidine] Other (See Comments)    "Hyper"  . Penicillins Rash    Current Outpatient Prescriptions  Medication Sig Dispense Refill  . acetaminophen (TYLENOL) 500 MG tablet Take 1,000 mg by mouth every 6 (six) hours as needed (for pain).    . Biotin 10 MG TABS Take 1 tablet by mouth 3 (three) times daily.    . bisoprolol-hydrochlorothiazide (ZIAC) 5-6.25 MG per tablet Take 1 tablet by mouth at bedtime.    . Calcium Carb-Cholecalciferol 500-400 MG-UNIT TABS Take 1 tablet by mouth 2 (two) times daily.    . diphenhydramine-acetaminophen (TYLENOL PM) 25-500 MG TABS Take 1-2 tablets by mouth at bedtime as needed.    Marland Kitchen FLUoxetine (PROZAC) 20 MG tablet Take 20 mg by mouth daily.    Marland Kitchen glimepiride (AMARYL) 4 MG tablet Take 4 mg by mouth 2 (two) times daily.    Marland Kitchen KLOR-CON M20 20 MEQ tablet TAKE 1 TABLET BY MOUTH ONCE A DAY 30 tablet 6  . letrozole (FEMARA) 2.5 MG tablet TAKE 1 TABLET BY MOUTH DAILY 30 tablet 5  . levothyroxine (SYNTHROID, LEVOTHROID) 175 MCG tablet Take 175 mcg by mouth daily before breakfast.    . loratadine  (CLARITIN) 10 MG tablet Take 10 mg by mouth daily.    . magnesium chloride (SLOW-MAG) 64 MG TBEC SR tablet Take 1 tablet by mouth 2 (two) times daily.    . meloxicam (MOBIC) 7.5 MG tablet Take by mouth.    . pioglitazone (ACTOS) 15 MG tablet Take 15 mg by mouth daily.    . simvastatin (ZOCOR) 20 MG tablet Take 20 mg by mouth at bedtime.    . sitaGLIPtin (JANUVIA) 100 MG tablet Take 100 mg by mouth daily.     No current facility-administered medications for this visit.    OBJECTIVE:  Filed Vitals:   05/04/15 1411  BP: 123/82  Pulse: 72  Temp: 97.5 F (36.4 C)  Resp: 18     Body mass index is 36.67 kg/(m^2).    ECOG FS:0 - Asymptomatic  PHYSICAL EXAM: GENERAL:  Well developed, well nourished, sitting comfortably in the exam room in no acute distress. MENTAL STATUS:  Alert and oriented to person, place and time. . ENT:  Oropharynx clear without lesion.  Tongue  normal. Mucous membranes moist.  RESPIRATORY:  Clear to auscultation without rales, wheezes or rhonchi. CARDIOVASCULAR:  Regular rate and rhythm without murmur, rub or gallop. BREAST:  Right breast without masses, skin changes or nipple discharge.  Left Breast: Chest wall area status post mastectomy no evidence of recurrent disease.  Grade 1 lymphedema. ABDOMEN:  Soft, non-tender, with active bowel sounds, and no hepatosplenomegaly.  No masses. BACK:  No CVA tenderness.  No tenderness on percussion of the back or rib cage. SKIN:  No rashes, ulcers or lesions. EXTREMITIES: No edema, no skin discoloration or tenderness.  No palpable cords. LYMPH NODES: No palpable cervical, supraclavicular, axillary or inguinal adenopathy  NEUROLOGICAL: Unremarkable. PSYCH:  Patient is somewhat depressed.   LAB RESULTS:  Appointment on 05/04/2015  Component Date Value Ref Range Status  . WBC 05/04/2015 9.8  3.6 - 11.0 K/uL Final  . RBC 05/04/2015 4.14  3.80 - 5.20 MIL/uL Final  . Hemoglobin 05/04/2015 12.1  12.0 - 16.0 g/dL Final  . HCT  05/04/2015 35.9  35.0 - 47.0 % Final  . MCV 05/04/2015 86.5  80.0 - 100.0 fL Final  . MCH 05/04/2015 29.2  26.0 - 34.0 pg Final  . MCHC 05/04/2015 33.7  32.0 - 36.0 g/dL Final  . RDW 05/04/2015 14.1  11.5 - 14.5 % Final  . Platelets 05/04/2015 283  150 - 440 K/uL Final  . Neutrophils Relative % 05/04/2015 83   Final  . Neutro Abs 05/04/2015 8.1* 1.4 - 6.5 K/uL Final  . Lymphocytes Relative 05/04/2015 11   Final  . Lymphs Abs 05/04/2015 1.0  1.0 - 3.6 K/uL Final  . Monocytes Relative 05/04/2015 5   Final  . Monocytes Absolute 05/04/2015 0.5  0.2 - 0.9 K/uL Final  . Eosinophils Relative 05/04/2015 1   Final  . Eosinophils Absolute 05/04/2015 0.1  0 - 0.7 K/uL Final  . Basophils Relative 05/04/2015 0   Final  . Basophils Absolute 05/04/2015 0.0  0 - 0.1 K/uL Final  . Sodium 05/04/2015 132* 135 - 145 mmol/L Final  . Potassium 05/04/2015 3.9  3.5 - 5.1 mmol/L Final  . Chloride 05/04/2015 104  101 - 111 mmol/L Final  . CO2 05/04/2015 25  22 - 32 mmol/L Final  . Glucose, Bld 05/04/2015 323* 65 - 99 mg/dL Final  . BUN 05/04/2015 21* 6 - 20 mg/dL Final  . Creatinine, Ser 05/04/2015 0.91  0.44 - 1.00 mg/dL Final  . Calcium 05/04/2015 8.6* 8.9 - 10.3 mg/dL Final  . Total Protein 05/04/2015 7.3  6.5 - 8.1 g/dL Final  . Albumin 05/04/2015 3.9  3.5 - 5.0 g/dL Final  . AST 05/04/2015 32  15 - 41 U/L Final  . ALT 05/04/2015 26  14 - 54 U/L Final  . Alkaline Phosphatase 05/04/2015 90  38 - 126 U/L Final  . Total Bilirubin 05/04/2015 0.6  0.3 - 1.2 mg/dL Final  . GFR calc non Af Amer 05/04/2015 >60  >60 mL/min Final  . GFR calc Af Amer 05/04/2015 >60  >60 mL/min Final   Comment: (NOTE) The eGFR has been calculated using the CKD EPI equation. This calculation has not been validated in all clinical situations. eGFR's persistently <60 mL/min signify possible Chronic Kidney Disease.   . Anion gap 05/04/2015 3* 5 - 15 Final  . Magnesium 05/04/2015 1.8  1.7 - 2.4 mg/dL Final       ASSESSMENT: Carcinoma of the left breast.  Continue letrozole therapy all lab data has been reviewed.  Tumor markers has been reported to be within acceptable range    MEDICAL DECISION MAKING:  Patient had a mammogram in April which was screening mammogram of the right breast.  Right breast calcification was found patient underwent diagnostic mammogram all the results have been reviewed.  Mammogram has been reviewed.  Biopsy versus follow-up in 6 month has been recommended.  Another is diagnostic mammogram of the right breast has been ordered in October of 2017 BONE  density will be repeated at the same time Continue letrozole and calcium   She will be followed by my associate because of my retirement plan.[place]  guidelines for follow-up in breast cancer patient History and physical 1-4 times per year as clinically indicated for first 5 years Periodic screening for changes in the family history and referable to genetic counseling is necessary. Mammographic every 12 months Educated monitor and refer patient for lymphedema management if needed Routine imaging of reconstructed breasts is not indicated In absence of clinical signs and symptoms suggestive of recurrent disease there is no indication for lab or imaging studies for metastatic screening Recommend on tamoxifen needs annual GYN evaluation if uterus is present.  Patient on aromatase inhibitor needs bone density study for evaluation regarding osteoporosis Evidence suggests that active lifestyle healthy diet limited alcohol intake and achieving i and maintaining ideal body weight may lead to optimal breast cancer outcome  No matching staging information was found for the patient.  Forest Gleason, MD   05/06/2015 2:20 AM

## 2015-08-14 ENCOUNTER — Telehealth: Payer: Self-pay | Admitting: *Deleted

## 2015-08-14 NOTE — Telephone Encounter (Signed)
T/C made to [facility] [place] for NSABP B-52 study follow up. Patient reports she is doing well overall, and denies any new problems. C/O TMJ symptoms, but states they are now controlled with Meloxicam. Also reports she lost her boyfriend Ray recently from an MI. States her next clinic appt is in October, 2017 and she will see Dr. Mike Gip for the first time. Instructed patient to continue to call if she has any new problems and Ms. Marton agrees. Yolande Jolly, BSN, MHA, OCN 08/14/2015 12:24 PM

## 2015-09-20 ENCOUNTER — Ambulatory Visit
Admission: RE | Admit: 2015-09-20 | Discharge: 2015-09-20 | Disposition: A | Discharge status: Home / Self Care | Payer: BC Managed Care – PPO | Source: Ambulatory Visit | Attending: Radiation Oncology | Admitting: Radiation Oncology

## 2015-09-20 ENCOUNTER — Encounter (INDEPENDENT_AMBULATORY_CARE_PROVIDER_SITE_OTHER): Payer: Self-pay

## 2015-09-20 ENCOUNTER — Encounter: Payer: Self-pay | Admitting: Radiation Oncology

## 2015-09-20 VITALS — BP 127/73 | HR 70 | Temp 96.4°F | Wt 227.0 lb

## 2015-09-20 DIAGNOSIS — Z9221 Personal history of antineoplastic chemotherapy: Secondary | ICD-10-CM | POA: Diagnosis not present

## 2015-09-20 DIAGNOSIS — Z9012 Acquired absence of left breast and nipple: Secondary | ICD-10-CM | POA: Diagnosis not present

## 2015-09-20 DIAGNOSIS — Z170 Estrogen receptor positive status [ER+]: Secondary | ICD-10-CM | POA: Insufficient documentation

## 2015-09-20 DIAGNOSIS — C50912 Malignant neoplasm of unspecified site of left female breast: Secondary | ICD-10-CM | POA: Insufficient documentation

## 2015-09-20 DIAGNOSIS — Z79811 Long term (current) use of aromatase inhibitors: Secondary | ICD-10-CM | POA: Insufficient documentation

## 2015-09-20 DIAGNOSIS — Z923 Personal history of irradiation: Secondary | ICD-10-CM | POA: Diagnosis not present

## 2015-09-20 NOTE — Progress Notes (Signed)
Radiation Oncology Follow up Note  Name: Mary Brady   Date:   09/20/2015 MRN:  [id number] DOB: [birth year]    This 65 y.o. female presents to the clinic today for one half year follow-up for left chest wall peripheral lymphatic radiation for T2 N1 ER/PR positive HER-2/neu overexpressed invasive mammary carcinoma status post adjuvant chemotherapy and left modified radical mastectomy for invasive breast cancer.Marland Kitchen  REFERRING PROVIDER: Maryland Pink, MD  HPI: Patient is a 65 year old female now out a year and a half having completed radiation therapy to her left chest wall and peripheral lymphatics for a stage TII N1 M0 ER/PR positive HER-2/neu overexpressed invasive mammary carcinoma status post neoadjuvant chemotherapy left modified radical mastectomy and adjuvant radiation therapy. She participated in NSABP protocol B-52 receiving Taxotere carboplatin and Herceptin and pertuzumab. She is currently on letrozole tolerating that well without side effect. She specifically denies any chest wall nodularity or masses or any swelling of her left upper extremity. Her most recent mammogram back in April showed probable benign findings with some right breast calcifications noted. Follow-up is planned.  COMPLICATIONS OF TREATMENT: none  FOLLOW UP COMPLIANCE: keeps appointments   PHYSICAL EXAM:  BP 127/73   Pulse 70   Temp (!) 96.4 F (35.8 C)   Wt 226 lb 15.4 oz (103 kg)   BMI 36.63 kg/m  Patient is status post left modified radical mastectomy. Chest walls clear without evidence of mass or nodularity. Right breast is free of dominant mass or nodularity in 2 positions examined. No axillary or supraclavicular adenopathy is identified. No evidence of lymphedema of her left upper extremity is noted. Well-developed well-nourished patient in NAD. HEENT reveals PERLA, EOMI, discs not visualized.  Oral cavity is clear. No oral mucosal lesions are identified. Neck is clear without evidence of cervical or  supraclavicular adenopathy. Lungs are clear to A&P. Cardiac examination is essentially unremarkable with regular rate and rhythm without murmur rub or thrill. Abdomen is benign with no organomegaly or masses noted. Motor sensory and DTR levels are equal and symmetric in the upper and lower extremities. Cranial nerves II through XII are grossly intact. Proprioception is intact. No peripheral adenopathy or edema is identified. No motor or sensory levels are noted. Crude visual fields are within normal range.  RADIOLOGY RESULTS: Mammograms are reviewed  PLAN: Present time she continues to do well with no evidence of disease. She will have follow-up mammograms to evaluate the calcifications in her right breast which are probably benign. I have asked to see her back in 1 year for follow-up. She continues on letrozole without side effect. Patient is to call sooner with any concerns.  I would like to take this opportunity to thank you for allowing me to participate in the care of your patient.Armstead Peaks., MD

## 2015-09-25 ENCOUNTER — Other Ambulatory Visit: Payer: Self-pay | Admitting: Oncology

## 2015-10-05 ENCOUNTER — Other Ambulatory Visit: Payer: Self-pay | Admitting: *Deleted

## 2015-10-05 MED ORDER — LETROZOLE 2.5 MG PO TABS: 2.5000 mg | ORAL_TABLET | Freq: Every day | ORAL | 0 refills | Status: DC

## 2015-10-09 ENCOUNTER — Other Ambulatory Visit: Payer: Self-pay | Admitting: *Deleted

## 2015-10-09 NOTE — Telephone Encounter (Signed)
Already filled 30 day supply, seeing new MD 10/19 and can fill for 90 days after that

## 2015-10-30 ENCOUNTER — Ambulatory Visit
Admission: RE | Admit: 2015-10-30 | Discharge: 2015-10-30 | Disposition: A | Discharge status: Home / Self Care | Payer: BC Managed Care – PPO | Source: Ambulatory Visit | Attending: Oncology | Admitting: Oncology

## 2015-10-30 ENCOUNTER — Other Ambulatory Visit: Payer: Self-pay | Admitting: Oncology

## 2015-10-30 DIAGNOSIS — C50912 Malignant neoplasm of unspecified site of left female breast: Secondary | ICD-10-CM

## 2015-10-30 DIAGNOSIS — Z13820 Encounter for screening for osteoporosis: Secondary | ICD-10-CM | POA: Insufficient documentation

## 2015-10-30 DIAGNOSIS — R921 Mammographic calcification found on diagnostic imaging of breast: Secondary | ICD-10-CM | POA: Insufficient documentation

## 2015-10-30 DIAGNOSIS — Z853 Personal history of malignant neoplasm of breast: Secondary | ICD-10-CM | POA: Insufficient documentation

## 2015-10-30 HISTORY — DX: Personal history of irradiation: Z92.3

## 2015-10-30 HISTORY — DX: Personal history of antineoplastic chemotherapy: Z92.21

## 2015-11-01 ENCOUNTER — Ambulatory Visit: Payer: BC Managed Care – PPO | Admitting: Hematology and Oncology

## 2015-11-01 ENCOUNTER — Other Ambulatory Visit: Payer: BC Managed Care – PPO

## 2015-11-02 ENCOUNTER — Ambulatory Visit: Payer: BC Managed Care – PPO | Admitting: Hematology and Oncology

## 2015-11-02 ENCOUNTER — Other Ambulatory Visit: Payer: BC Managed Care – PPO

## 2015-11-05 NOTE — Progress Notes (Signed)
[facility] day:  11/06/2015  Chief Complaint: Mary Brady is a 65 y.o. female with stage IIB left breast cancer who is seen for reassessment.  HPI: The patient's breast cancer was self discovered in 2015.  Mammogram and ultrasound on 04/07/2013 revealed a 4 cm irregular mass located within the subareolar portion of the left breast with nipple retraction and thickening of the skin of the nipple. There were at least 3 abnormal appearing level I left axillary lymph nodes.  Left ultrasound guided breast biopsy on 04/14/2015 revealed grade III invasive mammary carcinoma with no special type. Lymphovascular invasion was present.  Left axillary lymph node biopsy revealed metastatic carcinoma. Tumor was ER positive (1-5%), PR positive (1%) and HER-2/neu positive by FISH.  PET scan on 05/07/2013 revealed no evidence of metastatic disease.  There was a hypermetabolic left breast mass consistent with primary breast carcinoma and hypermetabolic left axillary and subpectoral metastatic nodes.  She enrolled on NSABP B-52 and randomized to carboplatin, Taxotere, Herceptin and Perjeta. She received 6 cycles of therapy from 05/10/2013 - 08/23/2013. She began maintenance Herceptin on 09/13/2013 and completed a year of therapy in 04/2014.  She underwent left modified radical mastectomy and axillary lymph node dissection on 10/05/2013 by Dr. Rochel Brome. Pathology revealed residual focal grade III invasive carcinoma, with microscopic foci, with lymphovascular invasion. Lymph nodes were negative for malignancy.  Pathologic stage was ypT1a(m) ypN0.  She began radiation therapy in 11/2013. She was complicated by cellulitis of the left chest wall with staph aureus. After completion of radiation, she began Femara 2.5 mg a day.  She later she started Femara in 02/2014.  She was last seen in the medical oncology clinic on 05/06/2015 by Dr. Oliva Bustard.  At that time, she was  doing well.  Right-sided mammogram on 04/24/2015 revealed probable benign right breast calcifications.  Bone density study on 10/30/2015 was normal with a T-score of -0.8 in the right femoral neck.  Right sided mammogram on 10/30/2015 revealed probable benign calcifications in the right breast.  Short term follow-up right mammogram in 6 months was recommended.  Symptomatically, she notes some hot flashes. She denies any joint complaints.  She notes some shortness of breath when walking up hills. She has some leg cramps for which she takes potassium and magnesium supplements.   Past Medical History:  Diagnosis Date  . Arthritis   . Breast cancer ([place]) 2015   LT MASTECTOMY  . Cataract   . Depression   . Diabetes mellitus without complication ([place])   . Dysfunctional uterine bleeding   . History of colonic polyps   . Hypertension   . Hypothyroidism 07/09/2007  . Personal history of chemotherapy 2015   BREAST CA  . Personal history of radiation therapy 2015   BREAST CA    Past Surgical History:  Procedure Laterality Date  . BREAST BIOPSY Left 03/2013   positive  . CATARACT EXTRACTION Bilateral 02/08/2013  . COLONOSCOPY    . COLONOSCOPY WITH PROPOFOL N/A 12/01/2014   Procedure: COLONOSCOPY WITH PROPOFOL;  Surgeon: Manya Silvas, MD;  Location: [facility] ENDOSCOPY;  Service: Endoscopy;  Laterality: N/A;  . ENDOMETRIAL BIOPSY    . MASTECTOMY MODIFIED RADICAL Left 10/05/2013   BREAST CA  . SKIN BIOPSY      Family History  Problem Relation Age of Onset  . Cancer Other   . Cancer Cousin   . Breast cancer Cousin     Social History:  reports that  she has never smoked. She has never used smokeless tobacco. She reports that she does not drink alcohol or use drugs.  She is a retired Sales executive.  She lives in [place].  The patient is alone today.  Allergies:  Allergies  Allergen Reactions  . Demerol [Meperidine] Other (See Comments)    "Hyper"  . Penicillins Rash     Current Medications: Current Outpatient Prescriptions  Medication Sig Dispense Refill  . acetaminophen (TYLENOL) 500 MG tablet Take 1,000 mg by mouth every 6 (six) hours as needed (for pain).    . Biotin 10 MG TABS Take 1 tablet by mouth 3 (three) times daily.    . bisoprolol-hydrochlorothiazide (ZIAC) 5-6.25 MG per tablet Take 1 tablet by mouth at bedtime.    . Calcium Carb-Cholecalciferol 500-400 MG-UNIT TABS Take 1 tablet by mouth 2 (two) times daily.    . diphenhydramine-acetaminophen (TYLENOL PM) 25-500 MG TABS Take 1-2 tablets by mouth at bedtime as needed.    Marland Kitchen FLUoxetine (PROZAC) 20 MG tablet Take 20 mg by mouth daily.    Marland Kitchen glimepiride (AMARYL) 4 MG tablet Take 4 mg by mouth 2 (two) times daily.    Marland Kitchen KLOR-CON M20 20 MEQ tablet TAKE 1 TABLET BY MOUTH ONCE A DAY 30 tablet 6  . letrozole (FEMARA) 2.5 MG tablet Take 1 tablet (2.5 mg total) by mouth daily. 30 tablet 0  . levothyroxine (SYNTHROID, LEVOTHROID) 175 MCG tablet Take 175 mcg by mouth daily before breakfast.    . loratadine (CLARITIN) 10 MG tablet Take 10 mg by mouth daily.    . magnesium chloride (SLOW-MAG) 64 MG TBEC SR tablet Take 1 tablet by mouth 2 (two) times daily.    . meloxicam (MOBIC) 7.5 MG tablet Take by mouth.    . pioglitazone (ACTOS) 15 MG tablet Take 15 mg by mouth daily.    . simvastatin (ZOCOR) 20 MG tablet Take 20 mg by mouth at bedtime.    . sitaGLIPtin (JANUVIA) 100 MG tablet Take 100 mg by mouth daily.     No current facility-administered medications for this visit.     Review of Systems:  GENERAL:  Feels good.  No fevers, sweats or weight loss. PERFORMANCE STATUS (ECOG):  0 HEENT:  No visual changes, runny nose, sore throat, mouth sores or tenderness. Lungs: Shortness of breath with exertion.  No cough.  No hemoptysis. Cardiac:  No chest pain, palpitations, orthopnea, or PND. GI:  No nausea, vomiting, diarrhea, constipation, melena or hematochezia.  Colonoscopy 12/01/2014. GU:  No urgency,  frequency, dysuria, or hematuria. Musculoskeletal:  No back pain.  No joint pain.  No muscle tenderness. Extremities:  Leg cramps.  No swelling. Skin:  No rashes or skin changes. Neuro:  No headache, numbness or weakness, balance or coordination issues. Endocrine:  Hot flashes.  Diabetes.  Thyroid disease on Synthroid.  No night sweats. Psych:  No mood changes, depression or anxiety. Pain:  No focal pain. Review of systems:  All other systems reviewed and found to be negative.  Physical Exam: Blood pressure 121/77, pulse 63, temperature (!) 96.7 F (35.9 C), temperature source Tympanic, resp. rate 18, weight 230 lb 9.6 oz (104.6 kg). GENERAL:  Well developed, well nourished, sitting comfortably in the exam room in no acute distress. MENTAL STATUS:  Alert and oriented to person, place and time. HEAD:  Short gray hair.  Normocephalic, atraumatic, face symmetric, no Cushingoid features. EYES:  Brown eyes.  Pupils equal round and reactive to light and  accomodation.  No conjunctivitis or scleral icterus. ENT:  Oropharynx clear without lesion.  Tongue normal. Mucous membranes moist.  RESPIRATORY:  Clear to auscultation without rales, wheezes or rhonchi. CARDIOVASCULAR:  Regular rate and rhythm without murmur, rub or gallop. BREAST:  Right breast with fibrocystic changes.  No masses, skin changes or nipple discharge.  Left mastectomy with no chest wall erythema or nodularity. ABDOMEN:  Soft, non-tender, with active bowel sounds, and no hepatosplenomegaly.  No masses. SKIN:  No rashes, ulcers or lesions. EXTREMITIES: No edema, no skin discoloration or tenderness.  No palpable cords. LYMPH NODES: No palpable cervical, supraclavicular, axillary or inguinal adenopathy  NEUROLOGICAL: Unremarkable. PSYCH:  Appropriate.  Appointment on 11/06/2015  Component Date Value Ref Range Status  . WBC 11/06/2015 7.8  3.6 - 11.0 K/uL Final  . RBC 11/06/2015 4.27  3.80 - 5.20 MIL/uL Final  . Hemoglobin  11/06/2015 12.1  12.0 - 16.0 g/dL Final  . HCT 11/06/2015 36.4  35.0 - 47.0 % Final  . MCV 11/06/2015 85.2  80.0 - 100.0 fL Final  . MCH 11/06/2015 28.4  26.0 - 34.0 pg Final  . MCHC 11/06/2015 33.3  32.0 - 36.0 g/dL Final  . RDW 11/06/2015 14.3  11.5 - 14.5 % Final  . Platelets 11/06/2015 286  150 - 440 K/uL Final  . Neutrophils Relative % 11/06/2015 75  % Final  . Neutro Abs 11/06/2015 5.9  1.4 - 6.5 K/uL Final  . Lymphocytes Relative 11/06/2015 16  % Final  . Lymphs Abs 11/06/2015 1.3  1.0 - 3.6 K/uL Final  . Monocytes Relative 11/06/2015 6  % Final  . Monocytes Absolute 11/06/2015 0.5  0.2 - 0.9 K/uL Final  . Eosinophils Relative 11/06/2015 2  % Final  . Eosinophils Absolute 11/06/2015 0.1  0 - 0.7 K/uL Final  . Basophils Relative 11/06/2015 1  % Final  . Basophils Absolute 11/06/2015 0.1  0 - 0.1 K/uL Final  . Sodium 11/06/2015 135  135 - 145 mmol/L Final  . Potassium 11/06/2015 3.8  3.5 - 5.1 mmol/L Final  . Chloride 11/06/2015 102  101 - 111 mmol/L Final  . CO2 11/06/2015 27  22 - 32 mmol/L Final  . Glucose, Bld 11/06/2015 246* 65 - 99 mg/dL Final  . BUN 11/06/2015 19  6 - 20 mg/dL Final  . Creatinine, Ser 11/06/2015 0.86  0.44 - 1.00 mg/dL Final  . Calcium 11/06/2015 8.7* 8.9 - 10.3 mg/dL Final  . Total Protein 11/06/2015 6.9  6.5 - 8.1 g/dL Final  . Albumin 11/06/2015 3.5  3.5 - 5.0 g/dL Final  . AST 11/06/2015 40  15 - 41 U/L Final  . ALT 11/06/2015 27  14 - 54 U/L Final  . Alkaline Phosphatase 11/06/2015 89  38 - 126 U/L Final  . Total Bilirubin 11/06/2015 0.3  0.3 - 1.2 mg/dL Final  . GFR calc non Af Amer 11/06/2015 >60  >60 mL/min Final  . GFR calc Af Amer 11/06/2015 >60  >60 mL/min Final   Comment: (NOTE) The eGFR has been calculated using the CKD EPI equation. This calculation has not been validated in all clinical situations. eGFR's persistently <60 mL/min signify possible Chronic Kidney Disease.   Georgiann Hahn gap 11/06/2015 6  5 - 15 Final    Assessment:  Mary Brady is a 65 y.o. female with clinical stage IIB Her2/neu + left breast cancer s/p mastectomy and axillary lymph node dissection after neoadjuvant chemotherapy.  Ultrasound guided breast biopsy on 04/14/2015 revealed  grade III invasive mammary carcinoma with no special type. Lymphovascular invasion was present.  Left axillary lymph node biopsy revealed metastatic carcinoma. Tumor was ER positive (1-5%), PR positive (1%) and HER-2/neu positive by FISH.  Clinical stage was T2N1M0.  Mammogram and ultrasound on 04/07/2013 revealed a 4 cm irregular mass located within the subareolar portion of the left breast with nipple retraction and thickening of the skin of the nipple. There were at least 3 abnormal appearing level I left axillary lymph nodes.  PET scan on 05/07/2013 revealed no evidence of metastatic disease.    She enrolled on NSABP B-52 and randomized to carboplatin, Taxotere, Herceptin and Perjeta. She received 6 cycles of therapy from 05/10/2013 - 08/23/2013. She began maintenance Herceptin on 09/13/2013 and completed a year of therapy in 04/2014.  Left modified radical mastectomy and axillary lymph node dissection on 10/05/2013 revealed residual focal grade III invasive carcinoma, with microscopic foci, with lymphovascular invasion.  Twelve lymph nodes were negative for malignancy.  Pathologic stage was ypT1a(m) ypN0.  She began radiation therapy in 11/2013. She was complicated by cellulitis of the left chest wall with staph aureus. In 02/2014, she began Femara 2.5 mg a day.    CA27.29 has been followed:  22.9 on 10/04/2013, 15.7 on 08/01/2014, 23.5 on 09/21/2014 and 15.2 on 10/31/2014.  Right sided mammogram on 10/30/2015 revealed probable benign calcifications in the right breast.  Short term follow-up right mammogram in 6 months was recommended.  Bone density study on 10/30/2015 was normal with a T-score of -0.8 in the right femoral neck.    Symptomatically, she notes some hot flashes  on Femara. Exam reveals no evidence of recurrent disease.  Plan: 1.  Review entire medical history, diagnosis and management of Her2/neu + breast cancer.  Discuss BCI testing in future.  Patient interested in reconstructive surgery. 2.  Labs today:  CBC with diff, CMP. 3.  Influenza vaccine today. 4.  Continue Femara. 5.  Plastic surgery consult- breast reconstruction 6.  Right mammogram on 04/29/2016. 7.  RTC in 4 months for MD assessment and labs (CBC with diff, CMP, CA27.29)   Lequita Asal, MD  11/06/2015, 5:15 PM

## 2015-11-06 ENCOUNTER — Inpatient Hospital Stay: Payer: BC Managed Care – PPO | Attending: Hematology and Oncology

## 2015-11-06 ENCOUNTER — Inpatient Hospital Stay: Payer: BC Managed Care – PPO

## 2015-11-06 ENCOUNTER — Inpatient Hospital Stay (HOSPITAL_BASED_OUTPATIENT_CLINIC_OR_DEPARTMENT_OTHER): Payer: BC Managed Care – PPO | Admitting: Hematology and Oncology

## 2015-11-06 ENCOUNTER — Other Ambulatory Visit: Payer: Self-pay | Admitting: *Deleted

## 2015-11-06 VITALS — BP 121/77 | HR 63 | Temp 96.7°F | Resp 18 | Wt 230.6 lb

## 2015-11-06 DIAGNOSIS — C778 Secondary and unspecified malignant neoplasm of lymph nodes of multiple regions: Secondary | ICD-10-CM | POA: Diagnosis not present

## 2015-11-06 DIAGNOSIS — I10 Essential (primary) hypertension: Secondary | ICD-10-CM

## 2015-11-06 DIAGNOSIS — Z803 Family history of malignant neoplasm of breast: Secondary | ICD-10-CM | POA: Diagnosis not present

## 2015-11-06 DIAGNOSIS — Z79811 Long term (current) use of aromatase inhibitors: Secondary | ICD-10-CM | POA: Insufficient documentation

## 2015-11-06 DIAGNOSIS — Z170 Estrogen receptor positive status [ER+]: Secondary | ICD-10-CM

## 2015-11-06 DIAGNOSIS — Z23 Encounter for immunization: Secondary | ICD-10-CM | POA: Insufficient documentation

## 2015-11-06 DIAGNOSIS — E119 Type 2 diabetes mellitus without complications: Secondary | ICD-10-CM | POA: Insufficient documentation

## 2015-11-06 DIAGNOSIS — R232 Flushing: Secondary | ICD-10-CM | POA: Insufficient documentation

## 2015-11-06 DIAGNOSIS — Z95828 Presence of other vascular implants and grafts: Secondary | ICD-10-CM

## 2015-11-06 DIAGNOSIS — Z9012 Acquired absence of left breast and nipple: Secondary | ICD-10-CM

## 2015-11-06 DIAGNOSIS — C50912 Malignant neoplasm of unspecified site of left female breast: Secondary | ICD-10-CM

## 2015-11-06 DIAGNOSIS — C7989 Secondary malignant neoplasm of other specified sites: Secondary | ICD-10-CM | POA: Insufficient documentation

## 2015-11-06 DIAGNOSIS — Z79899 Other long term (current) drug therapy: Secondary | ICD-10-CM | POA: Diagnosis not present

## 2015-11-06 DIAGNOSIS — E039 Hypothyroidism, unspecified: Secondary | ICD-10-CM

## 2015-11-06 DIAGNOSIS — Z923 Personal history of irradiation: Secondary | ICD-10-CM | POA: Diagnosis not present

## 2015-11-06 DIAGNOSIS — C50012 Malignant neoplasm of nipple and areola, left female breast: Secondary | ICD-10-CM

## 2015-11-06 DIAGNOSIS — Z880 Allergy status to penicillin: Secondary | ICD-10-CM

## 2015-11-06 DIAGNOSIS — Z9221 Personal history of antineoplastic chemotherapy: Secondary | ICD-10-CM | POA: Diagnosis not present

## 2015-11-06 DIAGNOSIS — R0609 Other forms of dyspnea: Secondary | ICD-10-CM | POA: Insufficient documentation

## 2015-11-06 LAB — COMPREHENSIVE METABOLIC PANEL
ALK PHOS: 89 U/L (ref 38–126)
ALT: 27 U/L (ref 14–54)
AST: 40 U/L (ref 15–41)
Albumin: 3.5 g/dL (ref 3.5–5.0)
Anion gap: 6 (ref 5–15)
BUN: 19 mg/dL (ref 6–20)
CALCIUM: 8.7 mg/dL — AB (ref 8.9–10.3)
CHLORIDE: 102 mmol/L (ref 101–111)
CO2: 27 mmol/L (ref 22–32)
CREATININE: 0.86 mg/dL (ref 0.44–1.00)
GFR calc Af Amer: >60 mL/min (ref >60)
Glucose, Bld: 246 mg/dL — ABNORMAL HIGH (ref 65–99)
Potassium: 3.8 mmol/L (ref 3.5–5.1)
SODIUM: 135 mmol/L (ref 135–145)
Total Bilirubin: 0.3 mg/dL (ref 0.3–1.2)
Total Protein: 6.9 g/dL (ref 6.5–8.1)

## 2015-11-06 LAB — CBC WITH DIFFERENTIAL/PLATELET
BASOS ABS: 0.1 10*3/uL (ref 0–0.1)
Basophils Relative: 1 %
EOS PCT: 2 %
Eosinophils Absolute: 0.1 10*3/uL (ref 0–0.7)
HCT: 36.4 % (ref 35.0–47.0)
HEMOGLOBIN: 12.1 g/dL (ref 12.0–16.0)
LYMPHS ABS: 1.3 10*3/uL (ref 1.0–3.6)
LYMPHS PCT: 16 %
MCH: 28.4 pg (ref 26.0–34.0)
MCHC: 33.3 g/dL (ref 32.0–36.0)
MCV: 85.2 fL (ref 80.0–100.0)
Monocytes Absolute: 0.5 10*3/uL (ref 0.2–0.9)
Monocytes Relative: 6 %
NEUTROS PCT: 75 %
Neutro Abs: 5.9 10*3/uL (ref 1.4–6.5)
PLATELETS: 286 10*3/uL (ref 150–440)
RBC: 4.27 MIL/uL (ref 3.80–5.20)
RDW: 14.3 % (ref 11.5–14.5)
WBC: 7.8 10*3/uL (ref 3.6–11.0)

## 2015-11-06 MED ORDER — INFLUENZA VAC SPLIT QUAD 0.5 ML IM SUSY
PREFILLED_SYRINGE | INTRAMUSCULAR | Status: AC
  Filled 2015-11-06: qty 0.5

## 2015-11-06 MED ORDER — INFLUENZA VAC SPLIT QUAD 0.5 ML IM SUSY
0.5000 mL | PREFILLED_SYRINGE | Freq: Once | INTRAMUSCULAR | Status: AC
  Administered 2015-11-06: 0.5 mL via INTRAMUSCULAR

## 2015-11-06 MED ORDER — HEPARIN SOD (PORK) LOCK FLUSH 100 UNIT/ML IV SOLN
500.0000 [IU] | Freq: Once | INTRAVENOUS | Status: AC
  Administered 2015-11-06: 500 [IU] via INTRAVENOUS

## 2015-11-06 MED ORDER — SODIUM CHLORIDE 0.9% FLUSH
10.0000 mL | INTRAVENOUS | Status: DC | PRN
  Administered 2015-11-06: 10 mL via INTRAVENOUS
  Filled 2015-11-06: qty 10

## 2015-11-06 NOTE — Progress Notes (Signed)
Patient states she is having problems sleeping at night due to having leg cramps.  She is taking K+ and Magnesium.  Also states she was recently @ [facility] for labs and has had problems with her right arm since being stuck.  States at first it was just below the [facility] but now she is hurting closer to her shoulder.  Otherwise no complaints.  Patient here today for 6 mo follow up regarding breast cancer.  Former patient of Dr. Oliva Bustard.

## 2015-12-04 ENCOUNTER — Other Ambulatory Visit: Payer: Self-pay | Admitting: Hematology and Oncology

## 2015-12-25 ENCOUNTER — Encounter: Payer: Self-pay | Admitting: Hematology and Oncology

## 2016-01-03 ENCOUNTER — Other Ambulatory Visit: Payer: Self-pay | Admitting: Hematology and Oncology

## 2016-01-29 ENCOUNTER — Other Ambulatory Visit: Payer: Self-pay | Admitting: Hematology and Oncology

## 2016-03-04 ENCOUNTER — Inpatient Hospital Stay: Payer: BC Managed Care – PPO | Attending: Hematology and Oncology

## 2016-03-04 ENCOUNTER — Inpatient Hospital Stay (HOSPITAL_BASED_OUTPATIENT_CLINIC_OR_DEPARTMENT_OTHER): Payer: BC Managed Care – PPO | Admitting: Hematology and Oncology

## 2016-03-04 ENCOUNTER — Inpatient Hospital Stay: Payer: BC Managed Care – PPO

## 2016-03-04 VITALS — BP 108/76 | HR 84 | Temp 97.5°F | Resp 18 | Wt 222.7 lb

## 2016-03-04 DIAGNOSIS — Z9012 Acquired absence of left breast and nipple: Secondary | ICD-10-CM

## 2016-03-04 DIAGNOSIS — R232 Flushing: Secondary | ICD-10-CM | POA: Insufficient documentation

## 2016-03-04 DIAGNOSIS — Z79899 Other long term (current) drug therapy: Secondary | ICD-10-CM

## 2016-03-04 DIAGNOSIS — Z9221 Personal history of antineoplastic chemotherapy: Secondary | ICD-10-CM | POA: Diagnosis not present

## 2016-03-04 DIAGNOSIS — E039 Hypothyroidism, unspecified: Secondary | ICD-10-CM

## 2016-03-04 DIAGNOSIS — M79601 Pain in right arm: Secondary | ICD-10-CM | POA: Insufficient documentation

## 2016-03-04 DIAGNOSIS — C50012 Malignant neoplasm of nipple and areola, left female breast: Secondary | ICD-10-CM

## 2016-03-04 DIAGNOSIS — Z170 Estrogen receptor positive status [ER+]: Secondary | ICD-10-CM | POA: Diagnosis not present

## 2016-03-04 DIAGNOSIS — Z923 Personal history of irradiation: Secondary | ICD-10-CM | POA: Diagnosis not present

## 2016-03-04 DIAGNOSIS — Z79811 Long term (current) use of aromatase inhibitors: Secondary | ICD-10-CM

## 2016-03-04 DIAGNOSIS — C778 Secondary and unspecified malignant neoplasm of lymph nodes of multiple regions: Secondary | ICD-10-CM | POA: Insufficient documentation

## 2016-03-04 DIAGNOSIS — C7989 Secondary malignant neoplasm of other specified sites: Secondary | ICD-10-CM | POA: Insufficient documentation

## 2016-03-04 DIAGNOSIS — I10 Essential (primary) hypertension: Secondary | ICD-10-CM | POA: Insufficient documentation

## 2016-03-04 DIAGNOSIS — Z7984 Long term (current) use of oral hypoglycemic drugs: Secondary | ICD-10-CM | POA: Diagnosis not present

## 2016-03-04 DIAGNOSIS — Z803 Family history of malignant neoplasm of breast: Secondary | ICD-10-CM | POA: Insufficient documentation

## 2016-03-04 DIAGNOSIS — Z880 Allergy status to penicillin: Secondary | ICD-10-CM | POA: Insufficient documentation

## 2016-03-04 DIAGNOSIS — E119 Type 2 diabetes mellitus without complications: Secondary | ICD-10-CM

## 2016-03-04 DIAGNOSIS — C50912 Malignant neoplasm of unspecified site of left female breast: Secondary | ICD-10-CM

## 2016-03-04 LAB — COMPREHENSIVE METABOLIC PANEL
ALT: 16 U/L (ref 14–54)
AST: 21 U/L (ref 15–41)
Albumin: 3.7 g/dL (ref 3.5–5.0)
Alkaline Phosphatase: 101 U/L (ref 38–126)
Anion gap: 8 (ref 5–15)
BUN: 15 mg/dL (ref 6–20)
CO2: 26 mmol/L (ref 22–32)
Calcium: 8.9 mg/dL (ref 8.9–10.3)
Chloride: 98 mmol/L — ABNORMAL LOW (ref 101–111)
Creatinine, Ser: 0.74 mg/dL (ref 0.44–1.00)
GFR calc Af Amer: >60 mL/min (ref >60)
GFR calc non Af Amer: >60 mL/min (ref >60)
Glucose, Bld: 215 mg/dL — ABNORMAL HIGH (ref 65–99)
Potassium: 3.5 mmol/L (ref 3.5–5.1)
Sodium: 132 mmol/L — ABNORMAL LOW (ref 135–145)
Total Bilirubin: 0.6 mg/dL (ref 0.3–1.2)
Total Protein: 7.4 g/dL (ref 6.5–8.1)

## 2016-03-04 LAB — CBC WITH DIFFERENTIAL/PLATELET
Basophils Absolute: 0.1 10*3/uL (ref 0–0.1)
Basophils Relative: 1 %
Eosinophils Absolute: 0.2 10*3/uL (ref 0–0.7)
Eosinophils Relative: 2 %
HCT: 33.9 % — ABNORMAL LOW (ref 35.0–47.0)
Hemoglobin: 11.7 g/dL — ABNORMAL LOW (ref 12.0–16.0)
Lymphocytes Relative: 10 %
Lymphs Abs: 1.1 10*3/uL (ref 1.0–3.6)
MCH: 28.5 pg (ref 26.0–34.0)
MCHC: 34.4 g/dL (ref 32.0–36.0)
MCV: 82.8 fL (ref 80.0–100.0)
Monocytes Absolute: 0.5 10*3/uL (ref 0.2–0.9)
Monocytes Relative: 5 %
Neutro Abs: 8.9 10*3/uL — ABNORMAL HIGH (ref 1.4–6.5)
Neutrophils Relative %: 82 %
Platelets: 319 10*3/uL (ref 150–440)
RBC: 4.1 MIL/uL (ref 3.80–5.20)
RDW: 14.3 % (ref 11.5–14.5)
WBC: 10.8 10*3/uL (ref 3.6–11.0)

## 2016-03-04 MED ORDER — HEPARIN SOD (PORK) LOCK FLUSH 100 UNIT/ML IV SOLN
500.0000 [IU] | Freq: Once | INTRAVENOUS | Status: AC
  Administered 2016-03-04: 500 [IU] via INTRAVENOUS
  Filled 2016-03-04: qty 5

## 2016-03-04 MED ORDER — SODIUM CHLORIDE 0.9% FLUSH
10.0000 mL | Freq: Once | INTRAVENOUS | Status: AC
  Administered 2016-03-04: 10 mL via INTRAVENOUS
  Filled 2016-03-04: qty 10

## 2016-03-04 NOTE — Progress Notes (Signed)
Patient here today for follow up regarding breast cancer.  States she sometimes have strange sensations in her right arm. Further states MD was to refer her to plastic surgeon but she was never notified of appointment.

## 2016-03-04 NOTE — Progress Notes (Signed)
[facility] day:  03/04/2016  Chief Complaint: AIMI Brady is a 66 y.o. female with stage IIB left breast cancer who is seen for 4 month assessment.  HPI: The patient was last seen in the medical oncology clinic on 11/06/2015.  At that time, she was seen for initial assessment by me.  She noted some hot flashes on Femara. Exam revealed no evidence of recurrent disease.  CBC and CMP were normal except for a blood sugar of 246.  She continued Femara.  She received the influenza vaccine.  She was interested in reconstructive surgery.  She was referred to plastic surgery.   During the interim, she has felt well. She has hot flashes that occur several times per week. She states they are becoming more tolerable and not lasting as long. She also notes once in awhile right sided shooting arm pain/ache. This happens several times per week and can last up to an hour. Appetite is good.  She denies any new lumps or bumps. She is scheduled to have her mammogram in April.    Past Medical History:  Diagnosis Date  . Arthritis   . Breast cancer ([place]) 2015   LT MASTECTOMY  . Cataract   . Depression   . Diabetes mellitus without complication ([place])   . Dysfunctional uterine bleeding   . History of colonic polyps   . Hypertension   . Hypothyroidism 07/09/2007  . Personal history of chemotherapy 2015   BREAST CA  . Personal history of radiation therapy 2015   BREAST CA    Past Surgical History:  Procedure Laterality Date  . BREAST BIOPSY Left 03/2013   positive  . CATARACT EXTRACTION Bilateral 02/08/2013  . COLONOSCOPY    . COLONOSCOPY WITH PROPOFOL N/A 12/01/2014   Procedure: COLONOSCOPY WITH PROPOFOL;  Surgeon: Manya Silvas, MD;  Location: [facility] ENDOSCOPY;  Service: Endoscopy;  Laterality: N/A;  . ENDOMETRIAL BIOPSY    . MASTECTOMY MODIFIED RADICAL Left 10/05/2013   BREAST CA  . SKIN BIOPSY      Family History  Problem Relation Age of  Onset  . Cancer Other   . Cancer Cousin   . Breast cancer Cousin     Social History:  reports that she has never smoked. She has never used smokeless tobacco. She reports that she does not drink alcohol or use drugs.  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  The patient is alone today.  Allergies:  Allergies  Allergen Reactions  . Demerol [Meperidine] Other (See Comments)    "Hyper"  . Penicillins Rash    Current Medications: Current Outpatient Prescriptions  Medication Sig Dispense Refill  . acetaminophen (TYLENOL) 500 MG tablet Take 1,000 mg by mouth every 6 (six) hours as needed (for pain).    . bisoprolol-hydrochlorothiazide (ZIAC) 5-6.25 MG per tablet Take 1 tablet by mouth at bedtime.    . Calcium Carb-Cholecalciferol 500-400 MG-UNIT TABS Take 1 tablet by mouth 2 (two) times daily.    . diphenhydramine-acetaminophen (TYLENOL PM) 25-500 MG TABS Take 1-2 tablets by mouth at bedtime as needed.    Marland Kitchen FLUoxetine (PROZAC) 20 MG tablet Take 20 mg by mouth daily.    Marland Kitchen glimepiride (AMARYL) 4 MG tablet Take 4 mg by mouth 2 (two) times daily.    Marland Kitchen KLOR-CON M20 20 MEQ tablet TAKE 1 TABLET BY MOUTH ONCE A DAY 30 tablet 6  . letrozole (FEMARA) 2.5 MG tablet  TAKE 1 TABLET (2.5 MG TOTAL) BY MOUTH DAILY. 30 tablet 0  . levothyroxine (SYNTHROID, LEVOTHROID) 175 MCG tablet Take 175 mcg by mouth daily before breakfast.    . loratadine (CLARITIN) 10 MG tablet Take 10 mg by mouth daily.    . pioglitazone (ACTOS) 15 MG tablet Take 15 mg by mouth daily.    . simvastatin (ZOCOR) 20 MG tablet Take 20 mg by mouth at bedtime.    . sitaGLIPtin (JANUVIA) 100 MG tablet Take 100 mg by mouth daily.    . Biotin 10 MG TABS Take 1 tablet by mouth 3 (three) times daily.    . magnesium chloride (SLOW-MAG) 64 MG TBEC SR tablet Take 1 tablet by mouth 2 (two) times daily.    . meloxicam (MOBIC) 7.5 MG tablet Take 7.5 mg by mouth as needed.  11   No current facility-administered medications  for this visit.     Review of Systems:  GENERAL:  Feels good.  No fevers or sweatrs. Weight down 8 pounds. PERFORMANCE STATUS (ECOG):  0 HEENT:  No visual changes, runny nose, sore throat, mouth sores or tenderness. Lungs: Shortness of breath with exertion.  No cough.  No hemoptysis. Cardiac:  No chest pain, palpitations, orthopnea, or PND. GI:  No nausea, vomiting, diarrhea, constipation, melena or hematochezia.  Colonoscopy 12/01/2014. GU:  No urgency, frequency, dysuria, or hematuria. Musculoskeletal:  Right arm ache/shooting pain.  No back pain.  No joint pain.  No muscle tenderness. Extremities:  Leg cramps.  No swelling. Skin:  No rashes or skin changes. Neuro:  No headache, numbness or weakness, balance or coordination issues. Endocrine:  Hot flashes, improved (see HPI).  No night sweats.  Diabetes.  Thyroid disease on Synthroid. Psych:  No mood changes, depression or anxiety. Pain:  No focal pain. Review of systems:  All other systems reviewed and found to be negative.  Physical Exam: Blood pressure 108/76, pulse 84, temperature 97.5 F (36.4 C), temperature source Tympanic, resp. rate 18, weight 222 lb 10.6 oz (101 kg). GENERAL:  Well developed, well nourished, woman sitting comfortably in the exam room in no acute distress. MENTAL STATUS:  Alert and oriented to person, place and time. HEAD:  Short gray hair.  Normocephalic, atraumatic, face symmetric, no Cushingoid features. EYES:  Brown eyes.  Pupils equal round and reactive to light and accomodation.  No conjunctivitis or scleral icterus. ENT:  Oropharynx clear without lesion.  Tongue normal. Mucous membranes moist.  RESPIRATORY:  Clear to auscultation without rales, wheezes or rhonchi. CARDIOVASCULAR:  Regular rate and rhythm without murmur, rub or gallop. BREAST:  Right breast with fibrocystic changes.  No masses, skin changes or nipple discharge.  Left mastectomy with no chest wall erythema or nodularity. ABDOMEN:  Soft,  non-tender, with active bowel sounds, and no hepatosplenomegaly.  No masses. SKIN:  No rashes, ulcers or lesions. EXTREMITIES: No edema, no skin discoloration or tenderness.  No palpable cords. LYMPH NODES: No palpable cervical, supraclavicular, axillary or inguinal adenopathy  NEUROLOGICAL: Unremarkable. PSYCH:  Appropriate.   Appointment on 03/04/2016  Component Date Value Ref Range Status  . WBC 03/04/2016 10.8  3.6 - 11.0 K/uL Final  . RBC 03/04/2016 4.10  3.80 - 5.20 MIL/uL Final  . Hemoglobin 03/04/2016 11.7* 12.0 - 16.0 g/dL Final  . HCT 03/04/2016 33.9* 35.0 - 47.0 % Final  . MCV 03/04/2016 82.8  80.0 - 100.0 fL Final  . MCH 03/04/2016 28.5  26.0 - 34.0 pg Final  . MCHC 03/04/2016 34.4  32.0 - 36.0 g/dL Final  . RDW 03/04/2016 14.3  11.5 - 14.5 % Final  . Platelets 03/04/2016 319  150 - 440 K/uL Final  . Neutrophils Relative % 03/04/2016 82  % Final  . Neutro Abs 03/04/2016 8.9* 1.4 - 6.5 K/uL Final  . Lymphocytes Relative 03/04/2016 10  % Final  . Lymphs Abs 03/04/2016 1.1  1.0 - 3.6 K/uL Final  . Monocytes Relative 03/04/2016 5  % Final  . Monocytes Absolute 03/04/2016 0.5  0.2 - 0.9 K/uL Final  . Eosinophils Relative 03/04/2016 2  % Final  . Eosinophils Absolute 03/04/2016 0.2  0 - 0.7 K/uL Final  . Basophils Relative 03/04/2016 1  % Final  . Basophils Absolute 03/04/2016 0.1  0 - 0.1 K/uL Final  . Sodium 03/04/2016 132* 135 - 145 mmol/L Final  . Potassium 03/04/2016 3.5  3.5 - 5.1 mmol/L Final  . Chloride 03/04/2016 98* 101 - 111 mmol/L Final  . CO2 03/04/2016 26  22 - 32 mmol/L Final  . Glucose, Bld 03/04/2016 215* 65 - 99 mg/dL Final  . BUN 03/04/2016 15  6 - 20 mg/dL Final  . Creatinine, Ser 03/04/2016 0.74  0.44 - 1.00 mg/dL Final  . Calcium 03/04/2016 8.9  8.9 - 10.3 mg/dL Final  . Total Protein 03/04/2016 7.4  6.5 - 8.1 g/dL Final  . Albumin 03/04/2016 3.7  3.5 - 5.0 g/dL Final  . AST 03/04/2016 21  15 - 41 U/L Final  . ALT 03/04/2016 16  14 - 54 U/L Final   . Alkaline Phosphatase 03/04/2016 101  38 - 126 U/L Final  . Total Bilirubin 03/04/2016 0.6  0.3 - 1.2 mg/dL Final  . GFR calc non Af Amer 03/04/2016 >60  >60 mL/min Final  . GFR calc Af Amer 03/04/2016 >60  >60 mL/min Final   Comment: (NOTE) The eGFR has been calculated using the CKD EPI equation. This calculation has not been validated in all clinical situations. eGFR's persistently <60 mL/min signify possible Chronic Kidney Disease.   . Anion gap 03/04/2016 8  5 - 15 Final    Assessment:  Mary Brady is a 65 y.o. female with clinical stage IIB Her2/neu + left breast cancer s/p mastectomy and axillary lymph node dissection after neoadjuvant chemotherapy.  Ultrasound guided breast biopsy on 04/14/2015 revealed grade III invasive mammary carcinoma with no special type. Lymphovascular invasion was present.  Left axillary lymph node biopsy revealed metastatic carcinoma. Tumor was ER positive (1-5%), PR positive (1%) and HER-2/neu positive by FISH.  Clinical stage was T2N1M0.  Mammogram and ultrasound on 04/07/2013 revealed a 4 cm irregular mass located within the subareolar portion of the left breast with nipple retraction and thickening of the skin of the nipple. There were at least 3 abnormal appearing level I left axillary lymph nodes.  PET scan on 05/07/2013 revealed no evidence of metastatic disease.    She enrolled on NSABP B-52 and randomized to carboplatin, Taxotere, Herceptin and Perjeta. She received 6 cycles of therapy from 05/10/2013 - 08/23/2013. She began maintenance Herceptin on 09/13/2013 and completed a year of therapy in 04/2014.  Left modified radical mastectomy and axillary lymph node dissection on 10/05/2013 revealed residual focal grade III invasive carcinoma, with microscopic foci, with lymphovascular invasion.  Twelve lymph nodes were negative for malignancy.  Pathologic stage was ypT1a(m) ypN0.  She began radiation therapy in 11/2013. She was complicated by  cellulitis of the left chest wall with staph aureus. In 02/2014, she began Femara  2.5 mg a day.    CA27.29 has been followed:  22.9 on 10/04/2013, 15.7 on 08/01/2014, 23.5 on 09/21/2014, 15.2 on 10/31/2014, and 16.7 on 03/04/2016.  Right sided mammogram on 10/30/2015 revealed probable benign calcifications in the right breast.  Short term follow-up right mammogram in 6 months was recommended.  Bone density study on 10/30/2015 was normal with a T-score of -0.8 in the right femoral neck.    Symptomatically, she notes improving hot flashes on Femara. Exam reveals no evidence of recurrent disease.  Plan: 1.  Labs today:  CBC with diff, CMP, CA27.29. 2.  Plastic surgery consult- breast reconstruction. 3.  Right mammogram on 04/29/2016. 4.  Continue Femara. 5.  RTC in 4 months for MD assess and labs (CBC with diff, CMP, CA27.29).   The patient was seen and examined.  The assessment and plan was discussed with the patient.  Multiple questions were asked and answered.   Faythe Casa, NP   Lequita Asal, MD  03/04/2016, 4:04 PM

## 2016-03-05 LAB — CANCER ANTIGEN 27.29: CA 27.29: 16.7 U/mL (ref 0.0–38.6)

## 2016-03-12 ENCOUNTER — Other Ambulatory Visit: Payer: Self-pay | Admitting: Hematology and Oncology

## 2016-03-20 ENCOUNTER — Encounter: Payer: Self-pay | Admitting: Hematology and Oncology

## 2016-04-19 ENCOUNTER — Other Ambulatory Visit: Payer: Self-pay | Admitting: Hematology and Oncology

## 2016-04-22 ENCOUNTER — Other Ambulatory Visit: Payer: Self-pay | Admitting: Hematology and Oncology

## 2016-04-30 ENCOUNTER — Ambulatory Visit: Admission: RE | Admit: 2016-04-30 | Payer: BC Managed Care – PPO | Source: Ambulatory Visit

## 2016-04-30 ENCOUNTER — Inpatient Hospital Stay: Admission: RE | Admit: 2016-04-30 | Payer: BC Managed Care – PPO | Source: Ambulatory Visit

## 2016-05-09 ENCOUNTER — Other Ambulatory Visit: Payer: Self-pay | Admitting: *Deleted

## 2016-05-09 MED ORDER — LETROZOLE 2.5 MG PO TABS
2.5000 mg | ORAL_TABLET | Freq: Every day | ORAL | 0 refills | Status: DC
Start: 2016-05-09 — End: 2016-08-29

## 2016-07-01 ENCOUNTER — Inpatient Hospital Stay: Payer: BC Managed Care – PPO | Admitting: Hematology and Oncology

## 2016-07-01 ENCOUNTER — Inpatient Hospital Stay: Payer: BC Managed Care – PPO

## 2016-07-30 ENCOUNTER — Ambulatory Visit: Payer: BC Managed Care – PPO | Admitting: Hematology and Oncology

## 2016-07-30 ENCOUNTER — Other Ambulatory Visit: Payer: BC Managed Care – PPO

## 2016-08-08 ENCOUNTER — Encounter: Payer: Self-pay | Admitting: Hematology and Oncology

## 2016-08-08 ENCOUNTER — Inpatient Hospital Stay: Payer: Medicare Other | Attending: Hematology and Oncology

## 2016-08-08 ENCOUNTER — Inpatient Hospital Stay (HOSPITAL_BASED_OUTPATIENT_CLINIC_OR_DEPARTMENT_OTHER): Payer: Medicare Other | Admitting: Hematology and Oncology

## 2016-08-08 VITALS — BP 117/75 | HR 79 | Temp 96.8°F | Resp 18 | Wt 232.6 lb

## 2016-08-08 DIAGNOSIS — Z923 Personal history of irradiation: Secondary | ICD-10-CM | POA: Diagnosis not present

## 2016-08-08 DIAGNOSIS — E039 Hypothyroidism, unspecified: Secondary | ICD-10-CM

## 2016-08-08 DIAGNOSIS — Z95828 Presence of other vascular implants and grafts: Secondary | ICD-10-CM

## 2016-08-08 DIAGNOSIS — C778 Secondary and unspecified malignant neoplasm of lymph nodes of multiple regions: Secondary | ICD-10-CM | POA: Diagnosis not present

## 2016-08-08 DIAGNOSIS — R232 Flushing: Secondary | ICD-10-CM

## 2016-08-08 DIAGNOSIS — Z7984 Long term (current) use of oral hypoglycemic drugs: Secondary | ICD-10-CM

## 2016-08-08 DIAGNOSIS — E119 Type 2 diabetes mellitus without complications: Secondary | ICD-10-CM | POA: Insufficient documentation

## 2016-08-08 DIAGNOSIS — Z170 Estrogen receptor positive status [ER+]: Secondary | ICD-10-CM | POA: Diagnosis not present

## 2016-08-08 DIAGNOSIS — Z9012 Acquired absence of left breast and nipple: Secondary | ICD-10-CM | POA: Insufficient documentation

## 2016-08-08 DIAGNOSIS — Z79811 Long term (current) use of aromatase inhibitors: Secondary | ICD-10-CM | POA: Insufficient documentation

## 2016-08-08 DIAGNOSIS — Z86010 Personal history of colonic polyps: Secondary | ICD-10-CM | POA: Diagnosis not present

## 2016-08-08 DIAGNOSIS — I10 Essential (primary) hypertension: Secondary | ICD-10-CM

## 2016-08-08 DIAGNOSIS — C50912 Malignant neoplasm of unspecified site of left female breast: Secondary | ICD-10-CM

## 2016-08-08 DIAGNOSIS — Z79899 Other long term (current) drug therapy: Secondary | ICD-10-CM

## 2016-08-08 DIAGNOSIS — Z9221 Personal history of antineoplastic chemotherapy: Secondary | ICD-10-CM | POA: Diagnosis not present

## 2016-08-08 DIAGNOSIS — Z880 Allergy status to penicillin: Secondary | ICD-10-CM | POA: Diagnosis not present

## 2016-08-08 DIAGNOSIS — C50012 Malignant neoplasm of nipple and areola, left female breast: Secondary | ICD-10-CM | POA: Insufficient documentation

## 2016-08-08 LAB — CBC WITH DIFFERENTIAL/PLATELET
Basophils Absolute: 0.1 10*3/uL (ref 0–0.1)
Basophils Relative: 1 %
Eosinophils Absolute: 0.1 10*3/uL (ref 0–0.7)
Eosinophils Relative: 2 %
HCT: 34.4 % — ABNORMAL LOW (ref 35.0–47.0)
Hemoglobin: 11.7 g/dL — ABNORMAL LOW (ref 12.0–16.0)
Lymphocytes Relative: 14 %
Lymphs Abs: 1.1 10*3/uL (ref 1.0–3.6)
MCH: 28.5 pg (ref 26.0–34.0)
MCHC: 34 g/dL (ref 32.0–36.0)
MCV: 83.8 fL (ref 80.0–100.0)
Monocytes Absolute: 0.4 10*3/uL (ref 0.2–0.9)
Monocytes Relative: 5 %
Neutro Abs: 6.4 10*3/uL (ref 1.4–6.5)
Neutrophils Relative %: 78 %
Platelets: 301 10*3/uL (ref 150–440)
RBC: 4.11 MIL/uL (ref 3.80–5.20)
RDW: 15.8 % — ABNORMAL HIGH (ref 11.5–14.5)
WBC: 8.1 10*3/uL (ref 3.6–11.0)

## 2016-08-08 LAB — COMPREHENSIVE METABOLIC PANEL
ALT: 22 U/L (ref 14–54)
AST: 30 U/L (ref 15–41)
Albumin: 3.7 g/dL (ref 3.5–5.0)
Alkaline Phosphatase: 83 U/L (ref 38–126)
Anion gap: 6 (ref 5–15)
BUN: 20 mg/dL (ref 6–20)
CO2: 26 mmol/L (ref 22–32)
Calcium: 9 mg/dL (ref 8.9–10.3)
Chloride: 102 mmol/L (ref 101–111)
Creatinine, Ser: 0.98 mg/dL (ref 0.44–1.00)
GFR calc Af Amer: >60 mL/min (ref >60)
GFR calc non Af Amer: 59 mL/min — ABNORMAL LOW (ref >60)
Glucose, Bld: 243 mg/dL — ABNORMAL HIGH (ref 65–99)
Potassium: 3.7 mmol/L (ref 3.5–5.1)
Sodium: 134 mmol/L — ABNORMAL LOW (ref 135–145)
Total Bilirubin: 0.5 mg/dL (ref 0.3–1.2)
Total Protein: 7.1 g/dL (ref 6.5–8.1)

## 2016-08-08 MED ORDER — SODIUM CHLORIDE 0.9% FLUSH
10.0000 mL | INTRAVENOUS | Status: DC | PRN
  Administered 2016-08-08: 10 mL via INTRAVENOUS
  Filled 2016-08-08: qty 10

## 2016-08-08 MED ORDER — HEPARIN SOD (PORK) LOCK FLUSH 100 UNIT/ML IV SOLN
500.0000 [IU] | Freq: Once | INTRAVENOUS | Status: AC
  Administered 2016-08-08: 500 [IU] via INTRAVENOUS

## 2016-08-08 NOTE — Progress Notes (Signed)
Patient offers no concerns today. 

## 2016-08-08 NOTE — Progress Notes (Signed)
[facility] day:  08/08/2016  Chief Complaint: Mary Brady is a 66 y.o. female with stage IIB left breast cancer who is seen for 5 month assessment.  HPI: The patient was last seen in the medical oncology clinic on 03/04/2016.  At that time, she had improving hot flashes on Femara. Exam reveals no evidence of recurrent disease. CA27.29 was normal.  She was interested in pursuing reconstructive surgery.  Mammogram was scheduled in 04/2016.  During the interim, she notes once in a while hot flashes. She states that she is "not found the time for reconstruction". She denies any concerns.   Past Medical History:  Diagnosis Date  . Arthritis   . Breast cancer ([place]) 2015   LT MASTECTOMY  . Cataract   . Depression   . Diabetes mellitus without complication ([place])   . Dysfunctional uterine bleeding   . History of colonic polyps   . Hypertension   . Hypothyroidism 07/09/2007  . Personal history of chemotherapy 2015   BREAST CA  . Personal history of radiation therapy 2015   BREAST CA    Past Surgical History:  Procedure Laterality Date  . BREAST BIOPSY Left 03/2013   positive  . CATARACT EXTRACTION Bilateral 02/08/2013  . COLONOSCOPY    . COLONOSCOPY WITH PROPOFOL N/A 12/01/2014   Procedure: COLONOSCOPY WITH PROPOFOL;  Surgeon: Manya Silvas, MD;  Location: [facility] ENDOSCOPY;  Service: Endoscopy;  Laterality: N/A;  . ENDOMETRIAL BIOPSY    . MASTECTOMY MODIFIED RADICAL Left 10/05/2013   BREAST CA  . SKIN BIOPSY      Family History  Problem Relation Age of Onset  . Cancer Other   . Cancer Cousin   . Breast cancer Cousin     Social History:  reports that she has never smoked. She has never used smokeless tobacco. She reports that she does not drink alcohol or use drugs.  She is a retired Sales executive.  She lives in [place].  The patient is alone today.  Allergies:  Allergies  Allergen Reactions  . Demerol  [Meperidine] Other (See Comments)    "Hyper"  . Penicillins Rash    Current Medications: Current Outpatient Prescriptions  Medication Sig Dispense Refill  . acetaminophen (TYLENOL) 500 MG tablet Take 1,000 mg by mouth every 6 (six) hours as needed (for pain).    . Biotin 10 MG TABS Take 1 tablet by mouth 3 (three) times daily.    . bisoprolol-hydrochlorothiazide (ZIAC) 5-6.25 MG per tablet Take 1 tablet by mouth at bedtime.    . Calcium Carb-Cholecalciferol 500-400 MG-UNIT TABS Take 1 tablet by mouth 2 (two) times daily.    . diphenhydramine-acetaminophen (TYLENOL PM) 25-500 MG TABS Take 1-2 tablets by mouth at bedtime as needed.    Marland Kitchen FLUoxetine (PROZAC) 20 MG tablet Take 20 mg by mouth daily.    Marland Kitchen glimepiride (AMARYL) 4 MG tablet Take 4 mg by mouth 2 (two) times daily.    Marland Kitchen KLOR-CON M20 20 MEQ tablet TAKE 1 TABLET BY MOUTH ONCE A DAY 30 tablet 6  . letrozole (FEMARA) 2.5 MG tablet Take 1 tablet (2.5 mg total) by mouth daily. 90 tablet 0  . levothyroxine (SYNTHROID, LEVOTHROID) 175 MCG tablet Take 175 mcg by mouth daily before breakfast.    . loratadine (CLARITIN) 10 MG tablet Take 10 mg by mouth daily.    . magnesium chloride (SLOW-MAG) 64 MG TBEC SR tablet Take 1 tablet by mouth 2 (  two) times daily.    . meloxicam (MOBIC) 7.5 MG tablet Take 7.5 mg by mouth as needed.  11  . pioglitazone (ACTOS) 15 MG tablet Take 15 mg by mouth daily.    . simvastatin (ZOCOR) 20 MG tablet Take 20 mg by mouth at bedtime.    . sitaGLIPtin (JANUVIA) 100 MG tablet Take 100 mg by mouth daily.     No current facility-administered medications for this visit.    Facility-Administered Medications Ordered in Other Visits  Medication Dose Route Frequency Provider Last Rate Last Dose  . sodium chloride flush (NS) 0.9 % injection 10 mL  10 mL Intravenous PRN Lequita Asal, MD   10 mL at 08/08/16 1012    Review of Systems:  GENERAL:  Feels "ok".  No fevers or sweats. Weight up 10 pounds. PERFORMANCE STATUS  (ECOG):  0 HEENT:  No visual changes, runny nose, sore throat, mouth sores or tenderness. Lungs:  No shortness of breath.  No cough.  No hemoptysis. Cardiac:  No chest pain, palpitations, orthopnea, or PND. GI:  No nausea, vomiting, diarrhea, constipation, melena or hematochezia.  Colonoscopy 12/01/2014. GU:  No urgency, frequency, dysuria, or hematuria. Musculoskeletal:  No back pain.  No joint pain.  No muscle tenderness. Extremities:  No pain or swelling. Skin:  No rashes or skin changes. Neuro:  No headache, numbness or weakness, balance or coordination issues. Endocrine:  Hot flashes, intermittent (see HPI).  No night sweats.  Diabetes.  Thyroid disease on Synthroid. Psych:  No mood changes, depression or anxiety. Pain:  No focal pain. Review of systems:  All other systems reviewed and found to be negative.  Physical Exam: Blood pressure 117/75, pulse 79, temperature (!) 96.8 F (36 C), temperature source Tympanic, resp. rate 18, weight 232 lb 9 oz (105.5 kg). GENERAL:  Well developed, well nourished, woman sitting comfortably in the exam room in no acute distress. MENTAL STATUS:  Alert and oriented to person, place and time. HEAD:  Short gray hair.  Normocephalic, atraumatic, face symmetric, no Cushingoid features. EYES:  Glasses.  Brown eyes.  Pupils equal round and reactive to light and accomodation.  No conjunctivitis or scleral icterus. ENT:  Oropharynx clear without lesion.  Tongue normal. Mucous membranes moist.  RESPIRATORY:  Clear to auscultation without rales, wheezes or rhonchi. CARDIOVASCULAR:  Regular rate and rhythm without murmur, rub or gallop. BREAST:  Right breast with fibrocystic changes.  No masses, skin changes or nipple discharge.  Left mastectomy with no chest wall erythema or nodularity. ABDOMEN:  Soft, non-tender, with active bowel sounds, and no hepatosplenomegaly.  No masses. SKIN:  No rashes, ulcers or lesions. EXTREMITIES: No edema, no skin discoloration  or tenderness.  No palpable cords. LYMPH NODES: No palpable cervical, supraclavicular, axillary or inguinal adenopathy  NEUROLOGICAL: Unremarkable. PSYCH:  Appropriate.   Infusion on 08/08/2016  Component Date Value Ref Range Status  . WBC 08/08/2016 8.1  3.6 - 11.0 K/uL Final  . RBC 08/08/2016 4.11  3.80 - 5.20 MIL/uL Final  . Hemoglobin 08/08/2016 11.7* 12.0 - 16.0 g/dL Final  . HCT 08/08/2016 34.4* 35.0 - 47.0 % Final  . MCV 08/08/2016 83.8  80.0 - 100.0 fL Final  . MCH 08/08/2016 28.5  26.0 - 34.0 pg Final  . MCHC 08/08/2016 34.0  32.0 - 36.0 g/dL Final  . RDW 08/08/2016 15.8* 11.5 - 14.5 % Final  . Platelets 08/08/2016 301  150 - 440 K/uL Final  . Neutrophils Relative % 08/08/2016 78  %  Final  . Neutro Abs 08/08/2016 6.4  1.4 - 6.5 K/uL Final  . Lymphocytes Relative 08/08/2016 14  % Final  . Lymphs Abs 08/08/2016 1.1  1.0 - 3.6 K/uL Final  . Monocytes Relative 08/08/2016 5  % Final  . Monocytes Absolute 08/08/2016 0.4  0.2 - 0.9 K/uL Final  . Eosinophils Relative 08/08/2016 2  % Final  . Eosinophils Absolute 08/08/2016 0.1  0 - 0.7 K/uL Final  . Basophils Relative 08/08/2016 1  % Final  . Basophils Absolute 08/08/2016 0.1  0 - 0.1 K/uL Final  . Sodium 08/08/2016 134* 135 - 145 mmol/L Final  . Potassium 08/08/2016 3.7  3.5 - 5.1 mmol/L Final  . Chloride 08/08/2016 102  101 - 111 mmol/L Final  . CO2 08/08/2016 26  22 - 32 mmol/L Final  . Glucose, Bld 08/08/2016 243* 65 - 99 mg/dL Final  . BUN 08/08/2016 20  6 - 20 mg/dL Final  . Creatinine, Ser 08/08/2016 0.98  0.44 - 1.00 mg/dL Final  . Calcium 08/08/2016 9.0  8.9 - 10.3 mg/dL Final  . Total Protein 08/08/2016 7.1  6.5 - 8.1 g/dL Final  . Albumin 08/08/2016 3.7  3.5 - 5.0 g/dL Final  . AST 08/08/2016 30  15 - 41 U/L Final  . ALT 08/08/2016 22  14 - 54 U/L Final  . Alkaline Phosphatase 08/08/2016 83  38 - 126 U/L Final  . Total Bilirubin 08/08/2016 0.5  0.3 - 1.2 mg/dL Final  . GFR calc non Af Amer 08/08/2016 59* >60  mL/min Final  . GFR calc Af Amer 08/08/2016 >60  >60 mL/min Final   Comment: (NOTE) The eGFR has been calculated using the CKD EPI equation. This calculation has not been validated in all clinical situations. eGFR's persistently <60 mL/min signify possible Chronic Kidney Disease.   Georgiann Hahn gap 08/08/2016 6  5 - 15 Final    Assessment:  SHANIK BROOKSHIRE is a 66 y.o. female with clinical stage IIB Her2/neu + left breast cancer s/p mastectomy and axillary lymph node dissection after neoadjuvant chemotherapy.  Ultrasound guided breast biopsy on 04/14/2015 revealed grade III invasive mammary carcinoma with no special type. Lymphovascular invasion was present.  Left axillary lymph node biopsy revealed metastatic carcinoma. Tumor was ER positive (1-5%), PR positive (1%) and HER-2/neu positive by FISH.  Clinical stage was T2N1M0.  Mammogram and ultrasound on 04/07/2013 revealed a 4 cm irregular mass located within the subareolar portion of the left breast with nipple retraction and thickening of the skin of the nipple. There were at least 3 abnormal appearing level I left axillary lymph nodes.  PET scan on 05/07/2013 revealed no evidence of metastatic disease.    She enrolled on NSABP B-52 and randomized to carboplatin, Taxotere, Herceptin and Perjeta. She received 6 cycles of therapy from 05/10/2013 - 08/23/2013. She began maintenance Herceptin on 09/13/2013 and completed a year of therapy in 04/2014.  Left modified radical mastectomy and axillary lymph node dissection on 10/05/2013 revealed residual focal grade III invasive carcinoma, with microscopic foci, with lymphovascular invasion.  Twelve lymph nodes were negative for malignancy.  Pathologic stage was ypT1a(m) ypN0.  She began radiation therapy in 11/2013. She was complicated by cellulitis of the left chest wall with staph aureus. In 02/2014, she began Femara 2.5 mg a day.    CA27.29 has been followed:  22.9 on 10/04/2013, 15.7 on 08/01/2014,  23.5 on 09/21/2014, 15.2 on 10/31/2014, 16.7 on 03/04/2016, and 20.8 on 08/08/2016.  Right sided mammogram  on 10/30/2015 revealed probable benign calcifications in the right breast.  Short term follow-up right mammogram in 6 months was recommended.  Bone density study on 10/30/2015 was normal with a T-score of -0.8 in the right femoral neck.    Symptomatically, she notes intermittent hot flashes on Femara. Exam reveals no evidence of recurrent disease.  Plan: 1.  Labs today:  CBC with diff, CMP, CA27.29. 2.  Reschedule right mammogram on 04/29/2016. 3.  Continue Femara. 4.  RTC in 6 months for MD assess and labs (CBC with diff, CMP, CA27.29).    Lequita Asal, MD  08/08/2016, 11:05 AM

## 2016-08-09 ENCOUNTER — Telehealth: Payer: Self-pay | Admitting: *Deleted

## 2016-08-09 LAB — CANCER ANTIGEN 27.29: CA 27.29: 20.8 U/mL (ref 0.0–38.6)

## 2016-08-09 NOTE — Telephone Encounter (Signed)
-----   Message from Lequita Asal, MD sent at 08/09/2016  3:27 PM EDT ----- Regarding: Please call patient  CA27.29 is normal.  M  ----- Message ----- From: Interface, Lab In [place] Sent: 08/08/2016  10:18 AM To: Lequita Asal, MD

## 2016-08-09 NOTE — Telephone Encounter (Signed)
Called patient and LVM that her CA 27.29 is normal. 

## 2016-08-21 ENCOUNTER — Ambulatory Visit
Admission: RE | Admit: 2016-08-21 | Discharge: 2016-08-21 | Disposition: A | Discharge status: Home / Self Care | Payer: Medicare Other | Source: Ambulatory Visit | Attending: Hematology and Oncology | Admitting: Hematology and Oncology

## 2016-08-21 DIAGNOSIS — Z170 Estrogen receptor positive status [ER+]: Principal | ICD-10-CM

## 2016-08-21 DIAGNOSIS — C50912 Malignant neoplasm of unspecified site of left female breast: Secondary | ICD-10-CM | POA: Diagnosis present

## 2016-08-21 DIAGNOSIS — R921 Mammographic calcification found on diagnostic imaging of breast: Secondary | ICD-10-CM | POA: Insufficient documentation

## 2016-08-21 DIAGNOSIS — R922 Inconclusive mammogram: Secondary | ICD-10-CM | POA: Diagnosis not present

## 2016-08-29 ENCOUNTER — Other Ambulatory Visit: Payer: Self-pay | Admitting: *Deleted

## 2016-08-29 MED ORDER — LETROZOLE 2.5 MG PO TABS: 2.5000 mg | ORAL_TABLET | Freq: Every day | ORAL | 1 refills | Status: DC

## 2016-09-19 ENCOUNTER — Ambulatory Visit
Admission: RE | Admit: 2016-09-19 | Discharge: 2016-09-19 | Disposition: A | Discharge status: Home / Self Care | Payer: Medicare Other | Source: Ambulatory Visit | Attending: Radiation Oncology | Admitting: Radiation Oncology

## 2016-09-19 ENCOUNTER — Encounter: Payer: Self-pay | Admitting: Radiation Oncology

## 2016-09-19 VITALS — BP 118/72 | Temp 97.3°F | Wt 232.0 lb

## 2016-09-19 DIAGNOSIS — Z9012 Acquired absence of left breast and nipple: Secondary | ICD-10-CM | POA: Insufficient documentation

## 2016-09-19 DIAGNOSIS — C50912 Malignant neoplasm of unspecified site of left female breast: Secondary | ICD-10-CM | POA: Diagnosis not present

## 2016-09-19 DIAGNOSIS — Z79811 Long term (current) use of aromatase inhibitors: Secondary | ICD-10-CM | POA: Insufficient documentation

## 2016-09-19 DIAGNOSIS — Z923 Personal history of irradiation: Secondary | ICD-10-CM | POA: Insufficient documentation

## 2016-09-19 DIAGNOSIS — Z170 Estrogen receptor positive status [ER+]: Secondary | ICD-10-CM | POA: Insufficient documentation

## 2016-09-19 NOTE — Progress Notes (Signed)
Radiation Oncology Follow up Note  Name: Mary Brady   Date:   09/19/2016 MRN:  [id number] DOB: 11-Nov-1950    This 66 y.o. female presents to the clinic today for 2-1/2 year follow-up status post radiation therapy to left chest wall peripheral lymphatics for a T2 N1 ER/PR positive HER-2/neu overexpressed invasive mammary carcinoma.  REFERRING PROVIDER: [place] Pink, MD  HPI: Patient is a 66 year old female now seen out 2-1/2 years have included radiation therapy to her left chest wall and peripheral lymphatics for a T2 N1 ER/PR positive HER-2/neu overexpressed invasive mammary carcinoma status post adjuvant chemotherapy than a left modified radical mastectomy. Seen today in routine follow-up she is doing well about a year ago she had some calcifications in the right breast which were followed thought to be benign no biopsy was performed. She specifically denies any chest wall nodularity or masses any swelling in her left upper extremity cough or bone pain. Follow-up mammograms of her right breast. From August 2018 show probable benign BI-RADS 3.   COMPLICATIONS OF TREATMENT: none  FOLLOW UP COMPLIANCE: keeps appointments   PHYSICAL EXAM:  BP 118/72   Temp (!) 97.3 F (36.3 C)   Wt 232 lb 0.6 oz (105.3 kg)   BMI 37.45 kg/m  Patient is status post left modified radical mastectomy. No evidence of mass or nodularity chest walls noted. Right breast is free of dominant mass or nodularity in 2 positions examined. No axillary or supraclavicular adenopathy is identified. No lymphedema of her left upper extremity is noted. Well-developed well-nourished patient in NAD. HEENT reveals PERLA, EOMI, discs not visualized.  Oral cavity is clear. No oral mucosal lesions are identified. Neck is clear without evidence of cervical or supraclavicular adenopathy. Lungs are clear to A&P. Cardiac examination is essentially unremarkable with regular rate and rhythm without murmur rub or thrill. Abdomen is benign  with no organomegaly or masses noted. Motor sensory and DTR levels are equal and symmetric in the upper and lower extremities. Cranial nerves II through XII are grossly intact. Proprioception is intact. No peripheral adenopathy or edema is identified. No motor or sensory levels are noted. Crude visual fields are within normal range.  RADIOLOGY RESULTS: Unilateral mammograms of the right breast are reviewed and compatible above-stated findings  PLAN: At this time she continues to do well with no evidence of disease. I'm please were overall progress. I've asked to see her back in 1 year for follow-up. She continues on Femara without side effect. Patient knows to call anytime with any problems. Follow-up mammograms ordered and scheduled for her right breast.  I would like to take this opportunity to thank you for allowing me to participate in the care of your patient.Armstead Peaks., MD

## 2017-02-10 ENCOUNTER — Inpatient Hospital Stay (HOSPITAL_BASED_OUTPATIENT_CLINIC_OR_DEPARTMENT_OTHER): Payer: Medicare Other | Admitting: Hematology and Oncology

## 2017-02-10 ENCOUNTER — Encounter: Payer: Self-pay | Admitting: *Deleted

## 2017-02-10 ENCOUNTER — Inpatient Hospital Stay: Payer: Medicare Other | Attending: Hematology and Oncology

## 2017-02-10 VITALS — BP 124/83 | HR 72 | Temp 98.0°F | Resp 18 | Wt 224.6 lb

## 2017-02-10 DIAGNOSIS — Z79811 Long term (current) use of aromatase inhibitors: Secondary | ICD-10-CM

## 2017-02-10 DIAGNOSIS — E039 Hypothyroidism, unspecified: Secondary | ICD-10-CM

## 2017-02-10 DIAGNOSIS — Z923 Personal history of irradiation: Secondary | ICD-10-CM | POA: Insufficient documentation

## 2017-02-10 DIAGNOSIS — C773 Secondary and unspecified malignant neoplasm of axilla and upper limb lymph nodes: Secondary | ICD-10-CM | POA: Insufficient documentation

## 2017-02-10 DIAGNOSIS — C50912 Malignant neoplasm of unspecified site of left female breast: Secondary | ICD-10-CM

## 2017-02-10 DIAGNOSIS — Z95828 Presence of other vascular implants and grafts: Secondary | ICD-10-CM

## 2017-02-10 DIAGNOSIS — C50112 Malignant neoplasm of central portion of left female breast: Secondary | ICD-10-CM | POA: Diagnosis not present

## 2017-02-10 DIAGNOSIS — Z170 Estrogen receptor positive status [ER+]: Secondary | ICD-10-CM

## 2017-02-10 LAB — COMPREHENSIVE METABOLIC PANEL
ALT: 27 U/L (ref 14–54)
AST: 31 U/L (ref 15–41)
Albumin: 3.8 g/dL (ref 3.5–5.0)
Alkaline Phosphatase: 114 U/L (ref 38–126)
Anion gap: 12 (ref 5–15)
BUN: 21 mg/dL — ABNORMAL HIGH (ref 6–20)
CO2: 25 mmol/L (ref 22–32)
Calcium: 8.9 mg/dL (ref 8.9–10.3)
Chloride: 94 mmol/L — ABNORMAL LOW (ref 101–111)
Creatinine, Ser: 0.97 mg/dL (ref 0.44–1.00)
GFR calc Af Amer: >60 mL/min (ref >60)
GFR calc non Af Amer: 60 mL/min — ABNORMAL LOW (ref >60)
Glucose, Bld: 374 mg/dL — ABNORMAL HIGH (ref 65–99)
Potassium: 3.5 mmol/L (ref 3.5–5.1)
Sodium: 131 mmol/L — ABNORMAL LOW (ref 135–145)
Total Bilirubin: 0.6 mg/dL (ref 0.3–1.2)
Total Protein: 7.2 g/dL (ref 6.5–8.1)

## 2017-02-10 LAB — CBC WITH DIFFERENTIAL/PLATELET
Basophils Absolute: 0.1 10*3/uL (ref 0–0.1)
Basophils Relative: 1 %
Eosinophils Absolute: 0.1 10*3/uL (ref 0–0.7)
Eosinophils Relative: 1 %
HCT: 37.8 % (ref 35.0–47.0)
Hemoglobin: 12.5 g/dL (ref 12.0–16.0)
Lymphocytes Relative: 16 %
Lymphs Abs: 1.3 10*3/uL (ref 1.0–3.6)
MCH: 29 pg (ref 26.0–34.0)
MCHC: 33.2 g/dL (ref 32.0–36.0)
MCV: 87.3 fL (ref 80.0–100.0)
Monocytes Absolute: 0.4 10*3/uL (ref 0.2–0.9)
Monocytes Relative: 5 %
Neutro Abs: 6.7 10*3/uL — ABNORMAL HIGH (ref 1.4–6.5)
Neutrophils Relative %: 77 %
Platelets: 322 10*3/uL (ref 150–440)
RBC: 4.33 MIL/uL (ref 3.80–5.20)
RDW: 14.8 % — ABNORMAL HIGH (ref 11.5–14.5)
WBC: 8.6 10*3/uL (ref 3.6–11.0)

## 2017-02-10 MED ORDER — SODIUM CHLORIDE 0.9% FLUSH
10.0000 mL | INTRAVENOUS | Status: AC | PRN
  Administered 2017-02-10: 10 mL
  Filled 2017-02-10: qty 10

## 2017-02-10 MED ORDER — HEPARIN SOD (PORK) LOCK FLUSH 100 UNIT/ML IV SOLN
500.0000 [IU] | INTRAVENOUS | Status: AC | PRN
  Administered 2017-02-10: 500 [IU]

## 2017-02-10 NOTE — Progress Notes (Signed)
[facility] day:  02/10/2017  Chief Complaint: Mary Brady is a 67 y.o. female with stage IIB left breast cancer who is seen for 6 month assessment.  HPI: The patient was last seen in the medical oncology clinic on 08/08/2016.  At that time, she noted intermittent hot flashes on Femara. Exam revealed no evidence of recurrent disease.  Right sided mammogram on 08/21/2016 revealed probably benign right breast calcifications.  Recommendation was a right diagnostic mammogram in 1 year.  During the interim, patient doing well overall. She does not verbalize any breast concerns. She has experienced no B symptoms or interval infections. She continues to have some hot flashes on Femara, however notes that they have improved. Patient experiences nocturnal leg cramping.  Patient eating well. Patient has intentionally lost 8 pounds.  Patient denies pain in the clinic today. Patient performs monthly self breast examinations as recommended.    Past Medical History:  Diagnosis Date  . Arthritis   . Breast cancer ([place]) 2015   LT MASTECTOMY  . Cataract   . Depression   . Diabetes mellitus without complication ([place])   . Dysfunctional uterine bleeding   . History of colonic polyps   . Hypertension   . Hypothyroidism 07/09/2007  . Personal history of chemotherapy 2015   BREAST CA  . Personal history of radiation therapy 2015   BREAST CA    Past Surgical History:  Procedure Laterality Date  . BREAST BIOPSY Left 03/2013   positive  . CATARACT EXTRACTION Bilateral 02/08/2013  . COLONOSCOPY    . COLONOSCOPY WITH PROPOFOL N/A 12/01/2014   Procedure: COLONOSCOPY WITH PROPOFOL;  Surgeon: Manya Silvas, MD;  Location: [facility] ENDOSCOPY;  Service: Endoscopy;  Laterality: N/A;  . ENDOMETRIAL BIOPSY    . MASTECTOMY MODIFIED RADICAL Left 10/05/2013   BREAST CA  . SKIN BIOPSY      Family History  Problem Relation Age of Onset  . Cancer Other   .  Cancer Cousin   . Breast cancer Cousin     Social History:  reports that  has never smoked. she has never used smokeless tobacco. She reports that she does not drink alcohol or use drugs.  She is a retired Sales executive.  She lives in [place].  The patient is alone today.  Allergies:  Allergies  Allergen Reactions  . Demerol [Meperidine] Other (See Comments)    "Hyper"  . Penicillins Rash    Current Medications: Current Outpatient Medications  Medication Sig Dispense Refill  . acetaminophen (TYLENOL) 500 MG tablet Take 1,000 mg by mouth every 6 (six) hours as needed (for pain).    . Biotin 10 MG TABS Take 1 tablet by mouth 3 (three) times daily.    . bisoprolol-hydrochlorothiazide (ZIAC) 5-6.25 MG per tablet Take 1 tablet by mouth at bedtime.    . Calcium Carb-Cholecalciferol 500-400 MG-UNIT TABS Take 1 tablet by mouth 2 (two) times daily.    . diphenhydramine-acetaminophen (TYLENOL PM) 25-500 MG TABS Take 1-2 tablets by mouth at bedtime as needed.    Marland Kitchen FLUoxetine (PROZAC) 20 MG tablet Take 20 mg by mouth daily.    Marland Kitchen glimepiride (AMARYL) 4 MG tablet Take 4 mg by mouth 2 (two) times daily.    Marland Kitchen KLOR-CON M20 20 MEQ tablet TAKE 1 TABLET BY MOUTH ONCE A DAY 30 tablet 6  . letrozole (FEMARA) 2.5 MG tablet Take 1 tablet (2.5 mg total) by mouth daily. 90 tablet 1  .  levothyroxine (SYNTHROID, LEVOTHROID) 175 MCG tablet Take 175 mcg by mouth daily before breakfast.    . loratadine (CLARITIN) 10 MG tablet Take 10 mg by mouth daily.    . magnesium chloride (SLOW-MAG) 64 MG TBEC SR tablet Take 1 tablet by mouth 2 (two) times daily.    . pioglitazone (ACTOS) 15 MG tablet Take 15 mg by mouth daily.    . simvastatin (ZOCOR) 20 MG tablet Take 20 mg by mouth at bedtime.    . sitaGLIPtin (JANUVIA) 100 MG tablet Take 100 mg by mouth daily.    . meloxicam (MOBIC) 7.5 MG tablet Take 7.5 mg by mouth as needed.  11   No current facility-administered medications for this visit.     Review of  Systems:  GENERAL:  Feels "ok".  No fevers or sweats. Weight down 8 pounds. PERFORMANCE STATUS (ECOG):  0 HEENT:  No visual changes, runny nose, sore throat, mouth sores or tenderness. Lungs:  No shortness of breath.  No cough.  No hemoptysis. Cardiac:  No chest pain, palpitations, orthopnea, or PND. GI:  No nausea, vomiting, diarrhea, constipation, melena or hematochezia.  Colonoscopy 12/01/2014. GU:  No urgency, frequency, dysuria, or hematuria. Musculoskeletal:  No back pain.  No joint pain.  No muscle tenderness. Extremities:  No pain or swelling. Skin:  No rashes or skin changes. Neuro:  Headaches felt related to allergies.  No numbness or weakness, balance or coordination issues. Endocrine:  Hot flashes, less.  No night sweats.  Diabetes.  Thyroid disease on Synthroid. Psych:  No mood changes, depression or anxiety. Pain:  No focal pain. Review of systems:  All other systems reviewed and found to be negative.  Physical Exam: Blood pressure 124/83, pulse 72, temperature 98 F (36.7 C), temperature source Tympanic, resp. rate 18, weight 224 lb 9 oz (101.9 kg). GENERAL:  Well developed, well nourished, woman sitting comfortably in the exam room in no acute distress. MENTAL STATUS:  Alert and oriented to person, place and time. HEAD:  Short gray hair.  Normocephalic, atraumatic, face symmetric, no Cushingoid features. EYES:  Brown eyes.  Pupils equal round and reactive to light and accomodation.  No conjunctivitis or scleral icterus. ENT:  Oropharynx clear without lesion.  Tongue normal. Mucous membranes moist.  RESPIRATORY:  Clear to auscultation without rales, wheezes or rhonchi. CARDIOVASCULAR:  Regular rate and rhythm without murmur, rub or gallop. BREAST:  Right breast with fibrocystic changes.  No masses, skin changes or nipple discharge.  Left mastectomy with no chest wall erythema or nodularity. ABDOMEN:  Soft, non-tender, with active bowel sounds, and no hepatosplenomegaly.  No  masses. SKIN:  No rashes, ulcers or lesions. EXTREMITIES: No edema, no skin discoloration or tenderness.  No palpable cords. LYMPH NODES: No palpable cervical, supraclavicular, axillary or inguinal adenopathy  NEUROLOGICAL: Unremarkable. PSYCH:  Appropriate.   Infusion on 02/10/2017  Component Date Value Ref Range Status  . Sodium 02/10/2017 131* 135 - 145 mmol/L Final  . Potassium 02/10/2017 3.5  3.5 - 5.1 mmol/L Final  . Chloride 02/10/2017 94* 101 - 111 mmol/L Final  . CO2 02/10/2017 25  22 - 32 mmol/L Final  . Glucose, Bld 02/10/2017 374* 65 - 99 mg/dL Final  . BUN 02/10/2017 21* 6 - 20 mg/dL Final  . Creatinine, Ser 02/10/2017 0.97  0.44 - 1.00 mg/dL Final  . Calcium 02/10/2017 8.9  8.9 - 10.3 mg/dL Final  . Total Protein 02/10/2017 7.2  6.5 - 8.1 g/dL Final  . Albumin 02/10/2017 3.8  3.5 - 5.0 g/dL Final  . AST 02/10/2017 31  15 - 41 U/L Final  . ALT 02/10/2017 27  14 - 54 U/L Final  . Alkaline Phosphatase 02/10/2017 114  38 - 126 U/L Final  . Total Bilirubin 02/10/2017 0.6  0.3 - 1.2 mg/dL Final  . GFR calc non Af Amer 02/10/2017 60* >60 mL/min Final  . GFR calc Af Amer 02/10/2017 >60  >60 mL/min Final   Comment: (NOTE) The eGFR has been calculated using the CKD EPI equation. This calculation has not been validated in all clinical situations. eGFR's persistently <60 mL/min signify possible Chronic Kidney Disease.   Georgiann Hahn gap 02/10/2017 12  5 - 15 Final   Performed at [facility], [address] [place], [place] [address]  . WBC 02/10/2017 8.6  3.6 - 11.0 K/uL Final  . RBC 02/10/2017 4.33  3.80 - 5.20 MIL/uL Final  . Hemoglobin 02/10/2017 12.5  12.0 - 16.0 g/dL Final  . HCT 02/10/2017 37.8  35.0 - 47.0 % Final  . MCV 02/10/2017 87.3  80.0 - 100.0 fL Final  . MCH 02/10/2017 29.0  26.0 - 34.0 pg Final  . MCHC 02/10/2017 33.2  32.0 - 36.0 g/dL Final  . RDW 02/10/2017 14.8* 11.5 - 14.5 % Final  . Platelets 02/10/2017 322  150 - 440 K/uL Final  . Neutrophils  Relative % 02/10/2017 77  % Final  . Neutro Abs 02/10/2017 6.7* 1.4 - 6.5 K/uL Final  . Lymphocytes Relative 02/10/2017 16  % Final  . Lymphs Abs 02/10/2017 1.3  1.0 - 3.6 K/uL Final  . Monocytes Relative 02/10/2017 5  % Final  . Monocytes Absolute 02/10/2017 0.4  0.2 - 0.9 K/uL Final  . Eosinophils Relative 02/10/2017 1  % Final  . Eosinophils Absolute 02/10/2017 0.1  0 - 0.7 K/uL Final  . Basophils Relative 02/10/2017 1  % Final  . Basophils Absolute 02/10/2017 0.1  0 - 0.1 K/uL Final   Performed at [facility], [address] [place], [place] [address]    Assessment:  AMEYAH BANGURA is a 67 y.o. female with clinical stage IIB Her2/neu + left breast cancer s/p mastectomy and axillary lymph node dissection after neoadjuvant chemotherapy.  Ultrasound guided breast biopsy on 04/14/2015 revealed grade III invasive mammary carcinoma with no special type. Lymphovascular invasion was present.  Left axillary lymph node biopsy revealed metastatic carcinoma. Tumor was ER positive (1-5%), PR positive (1%) and HER-2/neu positive by FISH.  Clinical stage was T2N1M0.  Mammogram and ultrasound on 04/07/2013 revealed a 4 cm irregular mass located within the subareolar portion of the left breast with nipple retraction and thickening of the skin of the nipple. There were at least 3 abnormal appearing level I left axillary lymph nodes.  PET scan on 05/07/2013 revealed no evidence of metastatic disease.    She enrolled on NSABP B-52 and randomized to carboplatin, Taxotere, Herceptin and Perjeta. She received 6 cycles of therapy from 05/10/2013 - 08/23/2013. She began maintenance Herceptin on 09/13/2013 and completed a year of therapy in 04/2014.  Left modified radical mastectomy and axillary lymph node dissection on 10/05/2013 revealed residual focal grade III invasive carcinoma, with microscopic foci, with lymphovascular invasion.  Twelve lymph nodes were negative for malignancy.  Pathologic stage was  ypT1a(m) ypN0.  She began radiation therapy in 11/2013. She was complicated by cellulitis of the left chest wall with staph aureus. In 02/2014, she began Femara 2.5 mg a day.  CA27.29 has been followed:  22.9 on 10/04/2013, 15.7 on 08/01/2014, 23.5 on 09/21/2014, 15.2 on 10/31/2014, 16.7 on 03/04/2016, 20.8 on 08/08/2016, and 19.6 on 02/10/2017.  Right sided mammogram on 10/30/2015 revealed probable benign calcifications in the right breast.  Right sided mammogram on 08/21/2016 revealed probably benign right breast calcifications  Bone density study on 10/30/2015 was normal with a T-score of -0.8 in the right femoral neck.    Symptomatically, she has mild hot flashes on Femara.  Exam is stable.  Plan: 1.  Labs today:  CBC with diff, CMP, CA27.29. 2.  Review interval mammogram- 2 groups of calcifications within the upper outer quadrant not significantly changed from 04/2015. 3.  Discuss port removal. Will send order over to Dr. Tamala Julian to  have port removed.  4.  Continue Femara as previously prescribed.  5.  RTC in 6 months for MD assess and labs (CBC with diff, CMP, CA27.29).    Honor Loh, NP  02/10/2017, 12:12 PM   I saw and evaluated the patient, participating in the key portions of the service and reviewing pertinent diagnostic studies and records.  I reviewed the nurse practitioner's note and agree with the findings and the plan.  The assessment and plan were discussed with the patient.  Several questions were asked by the patient and answered.   Nolon Stalls, MD 02/10/2017,12:12 PM

## 2017-02-10 NOTE — Progress Notes (Signed)
Pt in for 6 month follow up.  Denies any concern or difficulties.

## 2017-02-11 ENCOUNTER — Other Ambulatory Visit: Payer: Self-pay | Admitting: Urgent Care

## 2017-02-11 DIAGNOSIS — Z452 Encounter for adjustment and management of vascular access device: Secondary | ICD-10-CM

## 2017-02-11 LAB — CANCER ANTIGEN 27.29: CA 27.29: 19.6 U/mL (ref 0.0–38.6)

## 2017-02-16 ENCOUNTER — Encounter: Payer: Self-pay | Admitting: Hematology and Oncology

## 2017-08-08 ENCOUNTER — Other Ambulatory Visit: Payer: Self-pay | Admitting: *Deleted

## 2017-08-08 DIAGNOSIS — Z170 Estrogen receptor positive status [ER+]: Principal | ICD-10-CM

## 2017-08-08 DIAGNOSIS — C50912 Malignant neoplasm of unspecified site of left female breast: Secondary | ICD-10-CM

## 2017-08-08 IMAGING — MG MM DIGITAL DIAGNOSTIC UNILAT*R*
8 series · 8 of 8 positions shown · non-contrast
Comparison: Previous exam(s).

CLINICAL DATA: 64-year-old female, callback from screening
mammogram for possible right breast calcifications. The patient has
a history of left mastectomy.

EXAM:
DIGITAL DIAGNOSTIC RIGHT MAMMOGRAM WITH CAD

[R ML (1 of 5)]
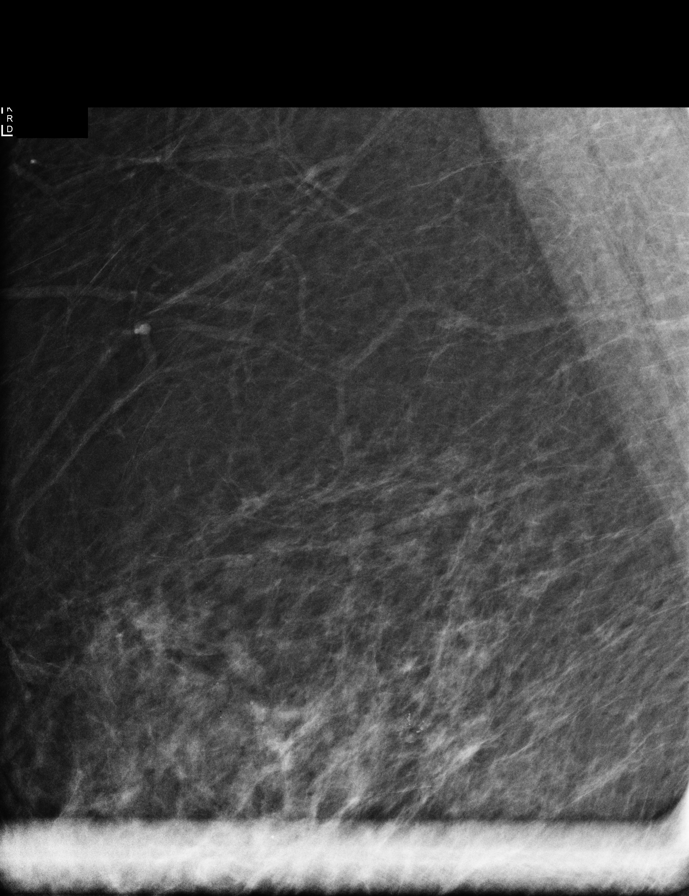

[R ML (2 of 5)]
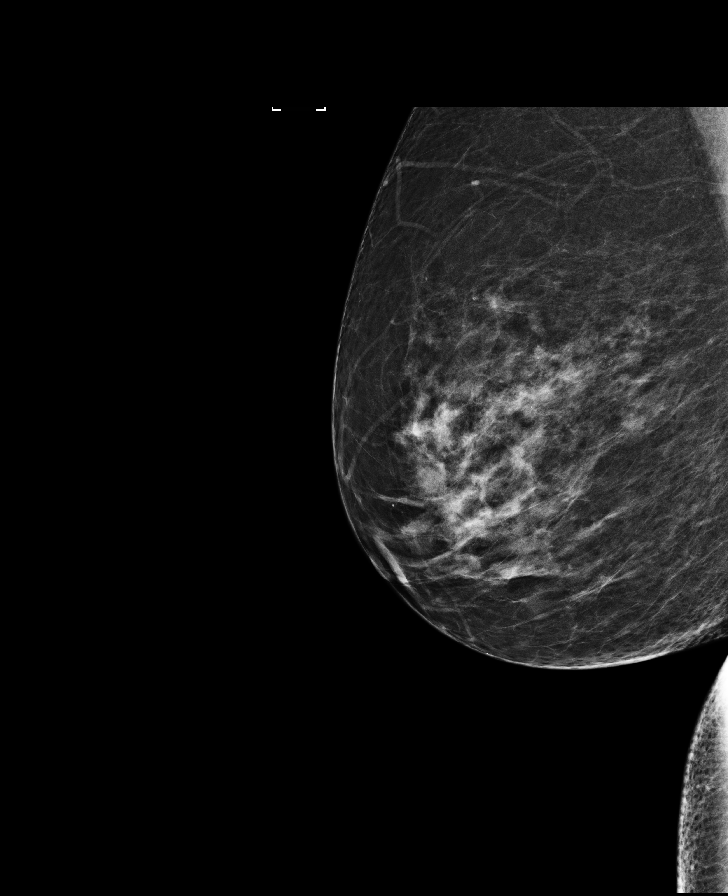

[R ML (3 of 5)]
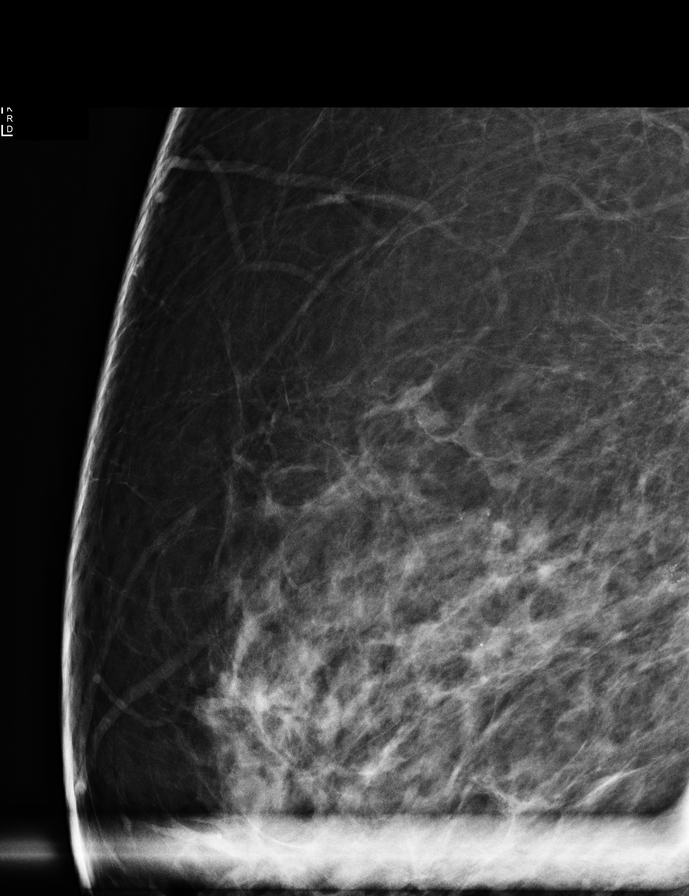

[R CC (1 of 3)]
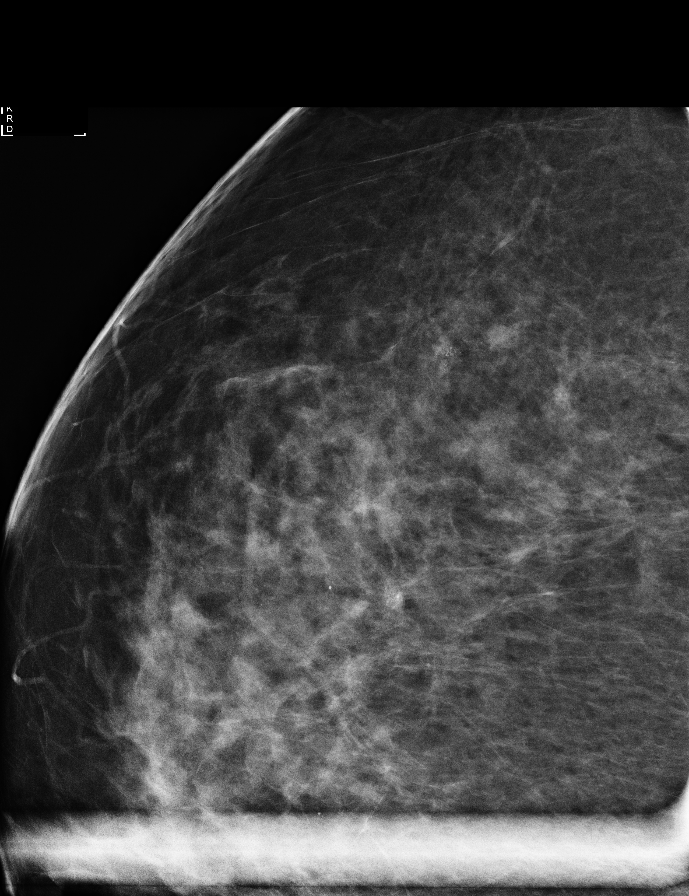

[R CC (2 of 3)]
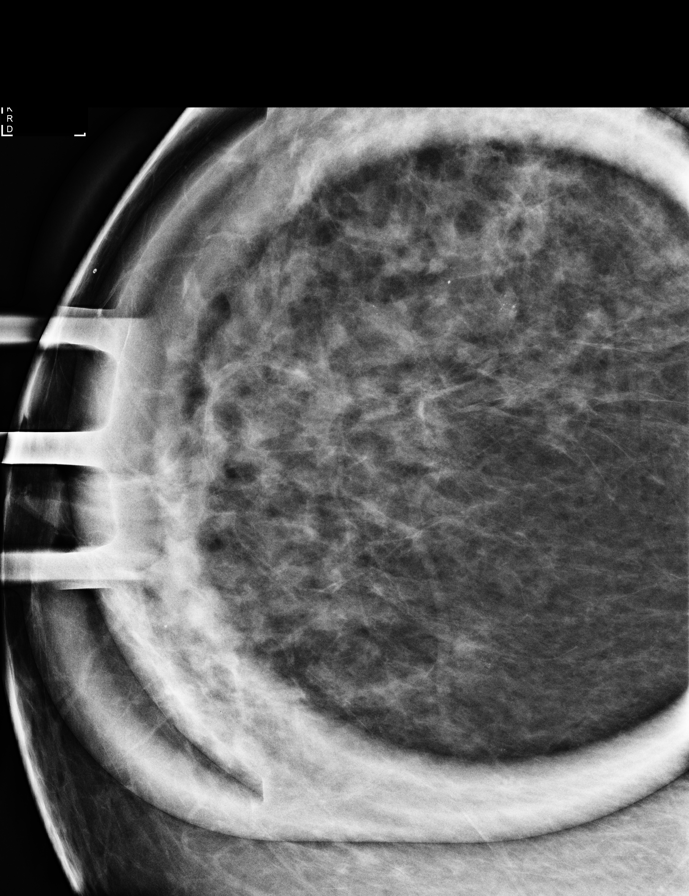

[R ML (4 of 5)]
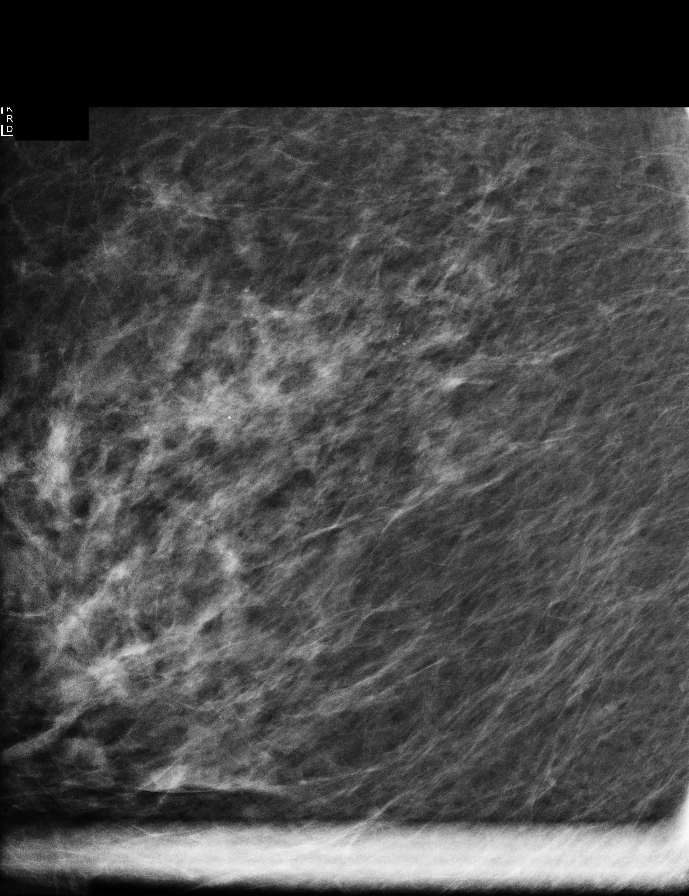

[R CC (3 of 3)]
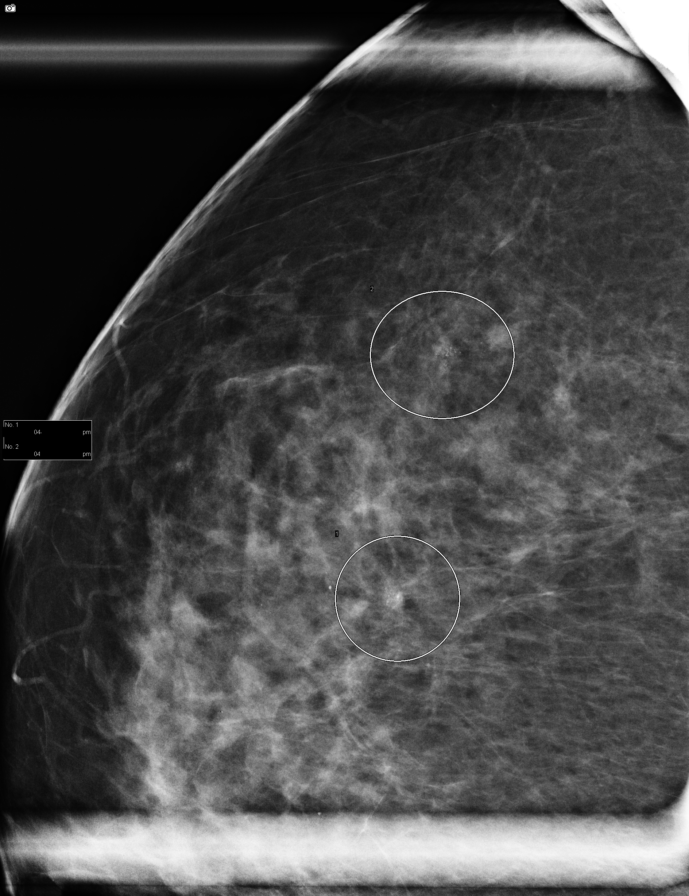

[R ML (5 of 5)]
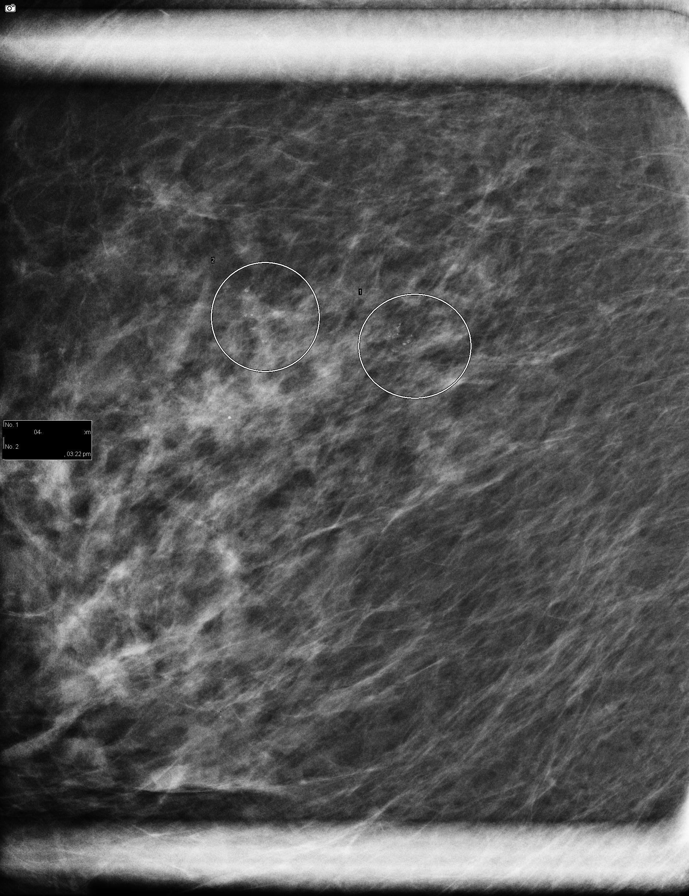

[8 of 8 positions shown; findings below may reference images not displayed]

ACR Breast Density Category c: The breast tissue is heterogeneously
dense, which may obscure small masses.
FINDINGS: A full lateral and CC and lateral magnification views of the right
breast were performed. On the additional views, there are 2,
similar-appearing groups of amorphous calcifications within the
upper, outer right breast which demonstrate the suggestion of
layering on the lateral view suggestive of milk of calcium. At least
some of these calcifications have been present since 5002. Precise
comparison is difficult due to differences in technique.

Mammographic images were processed with CAD.
IMPRESSION: Probably benign right breast calcifications.

RECOMMENDATION:
Options including short-term follow-up versus stereotactic guided
right breast biopsy were discussed the patient. The patient wishes
to pursue follow-up at this time. She plans to discuss her results
with Dr. Jalver at her upcoming follow-up appointment and will
contact the [REDACTED] should she choose to pursue biopsy.

I have discussed the findings and recommendations with the patient.
Results were also provided in writing at the conclusion of the
visit. If applicable, a reminder letter will be sent to the patient
regarding the next appointment.

BI-RADS CATEGORY  3: Probably benign finding(s) - short interval
follow-up suggested.

## 2017-08-11 ENCOUNTER — Inpatient Hospital Stay (HOSPITAL_BASED_OUTPATIENT_CLINIC_OR_DEPARTMENT_OTHER): Payer: Medicare Other | Admitting: Hematology and Oncology

## 2017-08-11 ENCOUNTER — Encounter: Payer: Self-pay | Admitting: Hematology and Oncology

## 2017-08-11 ENCOUNTER — Inpatient Hospital Stay: Payer: Medicare Other | Attending: Hematology and Oncology

## 2017-08-11 VITALS — BP 128/72 | HR 74 | Temp 96.2°F | Resp 18 | Wt 220.6 lb

## 2017-08-11 DIAGNOSIS — C50912 Malignant neoplasm of unspecified site of left female breast: Secondary | ICD-10-CM

## 2017-08-11 DIAGNOSIS — Z170 Estrogen receptor positive status [ER+]: Secondary | ICD-10-CM | POA: Diagnosis not present

## 2017-08-11 DIAGNOSIS — Z79811 Long term (current) use of aromatase inhibitors: Secondary | ICD-10-CM

## 2017-08-11 DIAGNOSIS — C50112 Malignant neoplasm of central portion of left female breast: Secondary | ICD-10-CM | POA: Insufficient documentation

## 2017-08-11 LAB — COMPREHENSIVE METABOLIC PANEL
ALT: 19 U/L (ref 0–44)
AST: 27 U/L (ref 15–41)
Albumin: 3.6 g/dL (ref 3.5–5.0)
Alkaline Phosphatase: 90 U/L (ref 38–126)
Anion gap: 12 (ref 5–15)
BUN: 22 mg/dL (ref 8–23)
CO2: 21 mmol/L — ABNORMAL LOW (ref 22–32)
Calcium: 9.7 mg/dL (ref 8.9–10.3)
Chloride: 100 mmol/L (ref 98–111)
Creatinine, Ser: 0.99 mg/dL (ref 0.44–1.00)
GFR calc Af Amer: >60 mL/min (ref >60)
GFR calc non Af Amer: 58 mL/min — ABNORMAL LOW (ref >60)
Glucose, Bld: 189 mg/dL — ABNORMAL HIGH (ref 70–99)
Potassium: 3.8 mmol/L (ref 3.5–5.1)
Sodium: 133 mmol/L — ABNORMAL LOW (ref 135–145)
Total Bilirubin: 0.8 mg/dL (ref 0.3–1.2)
Total Protein: 7.9 g/dL (ref 6.5–8.1)

## 2017-08-11 LAB — CBC WITH DIFFERENTIAL/PLATELET
Basophils Absolute: 0.1 10*3/uL (ref 0–0.1)
Basophils Relative: 1 %
Eosinophils Absolute: 0.1 10*3/uL (ref 0–0.7)
Eosinophils Relative: 1 %
HCT: 33.7 % — ABNORMAL LOW (ref 35.0–47.0)
Hemoglobin: 11.3 g/dL — ABNORMAL LOW (ref 12.0–16.0)
Lymphocytes Relative: 13 %
Lymphs Abs: 1.3 10*3/uL (ref 1.0–3.6)
MCH: 28.3 pg (ref 26.0–34.0)
MCHC: 33.4 g/dL (ref 32.0–36.0)
MCV: 84.7 fL (ref 80.0–100.0)
Monocytes Absolute: 0.5 10*3/uL (ref 0.2–0.9)
Monocytes Relative: 5 %
Neutro Abs: 8.4 10*3/uL — ABNORMAL HIGH (ref 1.4–6.5)
Neutrophils Relative %: 80 %
Platelets: 510 10*3/uL — ABNORMAL HIGH (ref 150–440)
RBC: 3.98 MIL/uL (ref 3.80–5.20)
RDW: 14.6 % — ABNORMAL HIGH (ref 11.5–14.5)
WBC: 10.4 10*3/uL (ref 3.6–11.0)

## 2017-08-11 NOTE — Progress Notes (Signed)
Patient states she has recently started Jardiance for her diabetes.  She is nauseated and does not feel well.  She plans to see NP @ [facility] tomorrow.  She did have a fall on 07-31-17.  No changes or c/o regarding her breast cancer.  Got her port out in February.

## 2017-08-11 NOTE — Progress Notes (Signed)
[facility] day:  08/11/2017  Chief Complaint: Mary Brady is a 67 y.o. female with stage IIB left breast cancer who is seen for 6 month assessment.  HPI: The patient was last seen in the medical oncology clinic on 02/11/2016.  At that time, she described mild hot flashes on Femara.  Exam was stable. Mammogram revealed stable calcifications.  CA27.29 was normal.  We discussed port removal.  During the interim, patient is doing well today. Patient has been having issues with her blood sugars. She notes recent changes in her antidiabetic medications. She is now on her Jardiance. She has been nauseated since starting this medication. Patient is supposed to be starting on Trulicity, however due to her current symptoms, patient notes that she is planning on deferring this medication for a "little while longer". She has also having loose stools since starting the next medication.   Patient sustained a mechanical fall on 07/31/2017, whereby she fell forward and struck her face. Patient has a headache today. She states, "I don't know if I have a concussion or what". Patient denies loss of consciousness. Patient with large fading area of bruising that extends from her knee down to her distal tib/fib area. Patient states, "that is one of my battle scars from the fall. I had one on my face, but it went away".   Since her last visit, patient has had her port removed. Her vasomotor symptoms have improved. Patient denies that she has experienced any B symptoms. She denies any interval infections.  Patient does not verbalize any concerns with regards to her breasts today. Patient performs monthly self breast examinations as recommended.   Patient advises that she maintains an adequate appetite. She is eating well. Weight today is 220 lb 9 oz (100 kg), which compared to her last visit to the clinic, represents a 4 pound decrease.    Patient complains of a headache  that she rates 7/10 today in clinic.    Past Medical History:  Diagnosis Date  . Arthritis   . Breast cancer ([place]) 2015   LT MASTECTOMY  . Cataract   . Depression   . Diabetes mellitus without complication ([place])   . Dysfunctional uterine bleeding   . History of colonic polyps   . Hypertension   . Hypothyroidism 07/09/2007  . Personal history of chemotherapy 2015   BREAST CA  . Personal history of radiation therapy 2015   BREAST CA    Past Surgical History:  Procedure Laterality Date  . BREAST BIOPSY Left 03/2013   positive  . CATARACT EXTRACTION Bilateral 02/08/2013  . COLONOSCOPY    . COLONOSCOPY WITH PROPOFOL N/A 12/01/2014   Procedure: COLONOSCOPY WITH PROPOFOL;  Surgeon: Manya Silvas, MD;  [facility] END[facility]COPY;  Service: Endoscopy;  Laterality: N/A;  . ENDOMETRIAL BIOPSY    . MASTECTOMY MODIFIED RADICAL Left 10/05/2013   BREAST CA  . SKIN BIOPSY      Family History  Problem Relation Age of Onset  . Cancer Other   . Cancer Cousin   . Breast cancer Cousin     Social History:  reports that she has never smoked. She has never used smokeless tobacco. She reports that she does not drink alcohol or use drugs.  She is a retired Sales executive.  She lives in [place].  The patient is alone today.  Allergies:  Allergies  Allergen Reactions  . Demerol [Meperidine] Other (See Comments)    "  Hyper"  . Penicillins Rash    Current Medications: Current Outpatient Medications  Medication Sig Dispense Refill  . acetaminophen (TYLENOL) 500 MG tablet Take 1,000 mg by mouth every 6 (six) hours as needed (for pain).    . bisoprolol-hydrochlorothiazide (ZIAC) 5-6.25 MG per tablet Take 1 tablet by mouth at bedtime.    . Calcium Carb-Cholecalciferol 500-400 MG-UNIT TABS Take 1 tablet by mouth 2 (two) times daily.    . empagliflozin (JARDIANCE) 25 MG TABS tablet Take 25 mg by mouth daily.    Marland Kitchen FLUoxetine (PROZAC) 20 MG tablet Take 20 mg by mouth daily.    Marland Kitchen  glimepiride (AMARYL) 4 MG tablet Take 4 mg by mouth 2 (two) times daily.    Marland Kitchen KLOR-CON M20 20 MEQ tablet TAKE 1 TABLET BY MOUTH ONCE A DAY 30 tablet 6  . letrozole (FEMARA) 2.5 MG tablet Take 1 tablet (2.5 mg total) by mouth daily. 90 tablet 1  . levothyroxine (SYNTHROID, LEVOTHROID) 175 MCG tablet Take 175 mcg by mouth daily before breakfast.    . loratadine (CLARITIN) 10 MG tablet Take 10 mg by mouth daily.    . magnesium chloride (SLOW-MAG) 64 MG TBEC SR tablet Take 1 tablet by mouth 2 (two) times daily.    . pioglitazone (ACTOS) 15 MG tablet Take 15 mg by mouth daily.    . simvastatin (ZOCOR) 20 MG tablet Take 20 mg by mouth at bedtime.    . Dulaglutide (TRULICITY) 2.99 ME/2.6ST SOPN Inject 0.5 mg into the skin once a week.     No current facility-administered medications for this visit.     Review of Systems  Constitutional: Negative for diaphoresis, fever, malaise/fatigue and weight loss (down 4 pounds).  HENT: Negative.   Eyes: Negative.   Respiratory: Negative for cough, hemoptysis, sputum production and shortness of breath.   Cardiovascular: Negative for chest pain, palpitations, orthopnea, leg swelling and PND.  Gastrointestinal: Negative for abdominal pain, blood in stool, constipation, diarrhea, melena, nausea and vomiting.  Genitourinary: Negative for dysuria, frequency, hematuria and urgency.  Musculoskeletal: Negative for back pain, falls, joint pain and myalgias.  Skin: Negative for itching and rash.  Neurological: Negative for dizziness, tremors, weakness and headaches.  Endo/Heme/Allergies: Does not bruise/bleed easily.       Diabetes.  Thyroid disease on Synthroid.  Psychiatric/Behavioral: Negative for depression, memory loss and suicidal ideas. The patient is not nervous/anxious and does not have insomnia.   All other systems reviewed and are negative.  Physical Exam: Blood pressure 128/72, pulse 74, temperature (!) 96.2 F (35.7 C), temperature source Tympanic,  resp. rate 18, weight 220 lb 9 oz (100 kg). GENERAL:  Well developed, well nourished, woman sitting comfortably in the exam room in no acute distress. MENTAL STATUS:  Alert and oriented to person, place and time. HEAD:  Shot gray hair.  Normocephalic, atraumatic, face symmetric, no Cushingoid features. EYES:  Brown eyes.  Pupils equal round and reactive to light and accomodation.  No conjunctivitis or scleral icterus. ENT:  Oropharynx clear without lesion.  Tongue normal. Mucous membranes moist.  RESPIRATORY:  Clear to auscultation without rales, wheezes or rhonchi. CARDIOVASCULAR:  Regular rate and rhythm without murmur, rub or gallop. BREAST:  Right breast without masses, skin changes or nipple discharge.  Left mastectomy.  No erythema or nodularity. ABDOMEN:  Soft, non-tender, with active bowel sounds, and no hepatosplenomegaly.  No masses. SKIN:  No rashes, ulcers or lesions. EXTREMITIES: No edema, no skin discoloration or tenderness.  No palpable cords.  LYMPH NODES: No palpable cervical, supraclavicular, axillary or inguinal adenopathy  NEUROLOGICAL: Unremarkable. PSYCH:  Appropriate.    Appointment on 08/11/2017  Component Date Value Ref Range Status  . Sodium 08/11/2017 133* 135 - 145 mmol/L Final  . Potassium 08/11/2017 3.8  3.5 - 5.1 mmol/L Final  . Chloride 08/11/2017 100  98 - 111 mmol/L Final  . CO2 08/11/2017 21* 22 - 32 mmol/L Final  . Glucose, Bld 08/11/2017 189* 70 - 99 mg/dL Final  . BUN 08/11/2017 22  8 - 23 mg/dL Final  . Creatinine, Ser 08/11/2017 0.99  0.44 - 1.00 mg/dL Final  . Calcium 08/11/2017 9.7  8.9 - 10.3 mg/dL Final  . Total Protein 08/11/2017 7.9  6.5 - 8.1 g/dL Final  . Albumin 08/11/2017 3.6  3.5 - 5.0 g/dL Final  . AST 08/11/2017 27  15 - 41 U/L Final  . ALT 08/11/2017 19  0 - 44 U/L Final  . Alkaline Phosphatase 08/11/2017 90  38 - 126 U/L Final  . Total Bilirubin 08/11/2017 0.8  0.3 - 1.2 mg/dL Final  . GFR calc non Af Amer 08/11/2017 58* >60  mL/min Final  . GFR calc Af Amer 08/11/2017 >60  >60 mL/min Final   Comment: (NOTE) The eGFR has been calculated using the CKD EPI equation. This calculation has not been validated in all clinical situations. eGFR's persistently <60 mL/min signify possible Chronic Kidney Disease.   Georgiann Hahn gap 08/11/2017 12  5 - 15 Final   Performed at [facility], [address] [place], [place] [address]  . WBC 08/11/2017 10.4  3.6 - 11.0 K/uL Final  . RBC 08/11/2017 3.98  3.80 - 5.20 MIL/uL Final  . Hemoglobin 08/11/2017 11.3* 12.0 - 16.0 g/dL Final  . HCT 08/11/2017 33.7* 35.0 - 47.0 % Final  . MCV 08/11/2017 84.7  80.0 - 100.0 fL Final  . MCH 08/11/2017 28.3  26.0 - 34.0 pg Final  . MCHC 08/11/2017 33.4  32.0 - 36.0 g/dL Final  . RDW 08/11/2017 14.6* 11.5 - 14.5 % Final  . Platelets 08/11/2017 510* 150 - 440 K/uL Final  . Neutrophils Relative % 08/11/2017 80  % Final  . Neutro Abs 08/11/2017 8.4* 1.4 - 6.5 K/uL Final  . Lymphocytes Relative 08/11/2017 13  % Final  . Lymphs Abs 08/11/2017 1.3  1.0 - 3.6 K/uL Final  . Monocytes Relative 08/11/2017 5  % Final  . Monocytes Absolute 08/11/2017 0.5  0.2 - 0.9 K/uL Final  . Eosinophils Relative 08/11/2017 1  % Final  . Eosinophils Absolute 08/11/2017 0.1  0 - 0.7 K/uL Final  . Basophils Relative 08/11/2017 1  % Final  . Basophils Absolute 08/11/2017 0.1  0 - 0.1 K/uL Final   Performed at [facility], [address] [place], [place] [address]    Assessment:  Mary Brady is a 67 y.o. female with clinical stage IIB Her2/neu + left breast cancer s/p mastectomy and axillary lymph node dissection after neoadjuvant chemotherapy.  Ultrasound guided breast biopsy on 04/14/2015 revealed grade III invasive mammary carcinoma with no special type. Lymphovascular invasion was present.  Left axillary lymph node biopsy revealed metastatic carcinoma. Tumor was ER positive (1-5%), PR positive (1%) and HER-2/neu positive by FISH.  Clinical stage was  T2N1M0.  Mammogram and ultrasound on 04/07/2013 revealed a 4 cm irregular mass located within the subareolar portion of the left breast with nipple retraction and thickening of the skin of the nipple. There were at least  3 abnormal appearing level I left axillary lymph nodes.  PET scan on 05/07/2013 revealed no evidence of metastatic disease.    She enrolled on NSABP B-52 and randomized to carboplatin, Taxotere, Herceptin and Perjeta. She received 6 cycles of therapy from 05/10/2013 - 08/23/2013. She began maintenance Herceptin on 09/13/2013 and completed a year of therapy in 04/2014.  Left modified radical mastectomy and axillary lymph node dissection on 10/05/2013 revealed residual focal grade III invasive carcinoma, with microscopic foci, with lymphovascular invasion.  Twelve lymph nodes were negative for malignancy.  Pathologic stage was ypT1a(m) ypN0.  She began radiation therapy in 11/2013. She was complicated by cellulitis of the left chest wall with staph aureus. She began Femara 2.5 mg a day in 02/2014.    CA27.29 has been followed:  22.9 on 10/04/2013, 15.7 on 08/01/2014, 23.5 on 09/21/2014, 15.2 on 10/31/2014, 16.7 on 03/04/2016, 20.8 on 08/08/2016, 19.6 on 02/10/2017, and 23.8 on 08/11/2017.  Right sided mammogram on 10/30/2015 revealed probable benign calcifications in the right breast.  Right sided mammogram on 08/21/2016 revealed probably benign right breast calcifications  Bone density study on 10/30/2015 was normal with a T-score of -0.8 in the right femoral neck.    Symptomatically, she is doing well on Femara.  Exam is stable.  Hemoglobin is 11.3.  Plan: 1.  Labs today:  CBC with diff, CMP, CA27.29. 2.  Breast cancer:    Discuss continuation of Femara.    Schedule mammogram 08/28/2017. 3.  Discuss issues with bone loss on Femara.  Discuss follow-up imaging every 2 years. 4.  Schedule bone density on 10/29/2017. 5.  RTC in 6 months for MD assessment, labs (CBC with diff,  CMP, CA27.29), and review of mammogram and bone density.   Honor Loh, NP  08/11/2017, 4:27 PM   I saw and evaluated the patient, participating in the key portions of the service and reviewing pertinent diagnostic studies and records.  I reviewed the nurse practitioner's note and agree with the findings and the plan.  The assessment and plan were discussed with the patient.  Several questions were asked by the patient and answered.   Nolon Stalls, MD 08/11/2017,4:27 PM

## 2017-08-12 LAB — CANCER ANTIGEN 27.29: CA 27.29: 23.8 U/mL (ref 0.0–38.6)

## 2017-08-13 ENCOUNTER — Other Ambulatory Visit: Payer: Self-pay | Admitting: *Deleted

## 2017-08-13 DIAGNOSIS — Z170 Estrogen receptor positive status [ER+]: Principal | ICD-10-CM

## 2017-08-13 DIAGNOSIS — C50912 Malignant neoplasm of unspecified site of left female breast: Secondary | ICD-10-CM

## 2017-08-29 ENCOUNTER — Inpatient Hospital Stay: Payer: Medicare Other | Attending: Hematology and Oncology

## 2017-08-29 ENCOUNTER — Ambulatory Visit
Admission: RE | Admit: 2017-08-29 | Discharge: 2017-08-29 | Disposition: A | Discharge status: Home / Self Care | Payer: Medicare Other | Source: Ambulatory Visit | Attending: Urgent Care | Admitting: Urgent Care

## 2017-08-29 DIAGNOSIS — E039 Hypothyroidism, unspecified: Secondary | ICD-10-CM | POA: Diagnosis not present

## 2017-08-29 DIAGNOSIS — C50112 Malignant neoplasm of central portion of left female breast: Secondary | ICD-10-CM | POA: Insufficient documentation

## 2017-08-29 DIAGNOSIS — Z170 Estrogen receptor positive status [ER+]: Secondary | ICD-10-CM

## 2017-08-29 DIAGNOSIS — C50912 Malignant neoplasm of unspecified site of left female breast: Secondary | ICD-10-CM

## 2017-08-29 LAB — RETICULOCYTES
RBC.: 3.16 MIL/uL — ABNORMAL LOW (ref 3.80–5.20)
Retic Count, Absolute: 72.7 10*3/uL (ref 19.0–183.0)
Retic Ct Pct: 2.3 % (ref 0.4–3.1)

## 2017-08-29 LAB — TSH: TSH: 0.31 u[IU]/mL — ABNORMAL LOW (ref 0.350–4.500)

## 2017-08-29 LAB — VITAMIN B12: Vitamin B-12: 397 pg/mL (ref 180–914)

## 2017-08-29 LAB — IRON AND TIBC
Iron: 64 ug/dL (ref 28–170)
Saturation Ratios: 17 % (ref 10.4–31.8)
TIBC: 373 ug/dL (ref 250–450)
UIBC: 309 ug/dL

## 2017-08-29 LAB — FERRITIN: Ferritin: 68 ng/mL (ref 11–307)

## 2017-08-29 LAB — FOLATE: Folate: 13.9 ng/mL (ref >5.9)

## 2017-09-01 ENCOUNTER — Other Ambulatory Visit: Payer: Self-pay | Admitting: Urgent Care

## 2017-09-01 DIAGNOSIS — R928 Other abnormal and inconclusive findings on diagnostic imaging of breast: Secondary | ICD-10-CM

## 2017-09-08 ENCOUNTER — Telehealth: Payer: Self-pay | Admitting: *Deleted

## 2017-09-08 NOTE — Telephone Encounter (Signed)
Called patient back at her request.  She wanted lab results from last Friday.. Both her TSH and RBC's were abnormal and I gave her the values, however, told the patient I would have MD look at results and call her back with any additional information from MD.  Patient states she is still having headaches.

## 2017-09-09 NOTE — Telephone Encounter (Signed)
  Please call patient.  TSH was low.  Needs free T4.  Minimal anemia.  B12, folate, amd iron studies are normal.  Etiology of headaches unknown.  Suggest follow-up with PCP.  M

## 2017-09-10 ENCOUNTER — Telehealth: Payer: Self-pay | Admitting: *Deleted

## 2017-09-10 ENCOUNTER — Ambulatory Visit: Admission: RE | Admit: 2017-09-10 | Payer: Medicare Other | Source: Ambulatory Visit

## 2017-09-10 ENCOUNTER — Other Ambulatory Visit: Payer: Self-pay | Admitting: *Deleted

## 2017-09-10 DIAGNOSIS — C50912 Malignant neoplasm of unspecified site of left female breast: Secondary | ICD-10-CM

## 2017-09-10 DIAGNOSIS — Z170 Estrogen receptor positive status [ER+]: Principal | ICD-10-CM

## 2017-09-10 NOTE — Telephone Encounter (Signed)
-----   Message from Karen Kitchens, NP sent at 09/09/2017  9:44 PM EDT ----- TSH low. Needs free T4. Have her come back for this lab, or she can speak to her PCP about it. If she elects not to return, forward TSH to PCP through epic with a note saying that we wanted to do additional labs, however she wanted to follow up with them.   Etiology of headache unknown. Have her follow up with PCP for further evaluation and treatment.   Gaspar Bidding

## 2017-09-10 NOTE — Telephone Encounter (Signed)
Called patient to inform her that her TSH is low and and she will need a Free T-4 drawn.  Patient was given option to either come to [facility] or see her PCP for the lab.  She prefers to come to [place] for lab.  I informed her that I will schedule it for tomorrow @ 11 at her request.  Once we have the result, I will send it to PCP for review.  As far as the reasons of her headaches she is advised to follow up with PCP.  Patient verbalized understanding.

## 2017-09-11 ENCOUNTER — Inpatient Hospital Stay: Payer: Medicare Other

## 2017-09-12 ENCOUNTER — Inpatient Hospital Stay: Payer: Medicare Other

## 2017-09-12 DIAGNOSIS — C50912 Malignant neoplasm of unspecified site of left female breast: Secondary | ICD-10-CM

## 2017-09-12 DIAGNOSIS — C50112 Malignant neoplasm of central portion of left female breast: Secondary | ICD-10-CM | POA: Diagnosis not present

## 2017-09-12 DIAGNOSIS — Z170 Estrogen receptor positive status [ER+]: Principal | ICD-10-CM

## 2017-09-12 LAB — T4, FREE: Free T4: 1.43 ng/dL (ref 0.82–1.77)

## 2017-09-15 ENCOUNTER — Encounter: Payer: Self-pay | Admitting: Hematology and Oncology

## 2017-09-17 ENCOUNTER — Other Ambulatory Visit: Payer: Self-pay | Admitting: Hematology and Oncology

## 2017-10-08 ENCOUNTER — Ambulatory Visit
Admission: RE | Admit: 2017-10-08 | Discharge: 2017-10-08 | Disposition: A | Discharge status: Home / Self Care | Payer: Medicare Other | Source: Ambulatory Visit | Attending: Urgent Care | Admitting: Urgent Care

## 2017-10-08 ENCOUNTER — Other Ambulatory Visit: Payer: Self-pay | Admitting: Urgent Care

## 2017-10-08 DIAGNOSIS — R928 Other abnormal and inconclusive findings on diagnostic imaging of breast: Secondary | ICD-10-CM

## 2017-10-08 HISTORY — PX: BREAST BIOPSY: SHX20

## 2017-10-09 LAB — SURGICAL PATHOLOGY

## 2017-10-23 ENCOUNTER — Encounter: Payer: Self-pay | Admitting: Radiation Oncology

## 2017-10-23 ENCOUNTER — Other Ambulatory Visit: Payer: Self-pay

## 2017-10-23 ENCOUNTER — Ambulatory Visit
Admission: RE | Admit: 2017-10-23 | Discharge: 2017-10-23 | Disposition: A | Discharge status: Home / Self Care | Payer: Medicare Other | Source: Ambulatory Visit | Attending: Radiation Oncology | Admitting: Radiation Oncology

## 2017-10-23 DIAGNOSIS — Z170 Estrogen receptor positive status [ER+]: Secondary | ICD-10-CM | POA: Diagnosis not present

## 2017-10-23 DIAGNOSIS — C50912 Malignant neoplasm of unspecified site of left female breast: Secondary | ICD-10-CM | POA: Diagnosis not present

## 2017-10-23 DIAGNOSIS — Z9012 Acquired absence of left breast and nipple: Secondary | ICD-10-CM | POA: Insufficient documentation

## 2017-10-23 DIAGNOSIS — Z79811 Long term (current) use of aromatase inhibitors: Secondary | ICD-10-CM | POA: Insufficient documentation

## 2017-10-23 DIAGNOSIS — Z923 Personal history of irradiation: Secondary | ICD-10-CM | POA: Insufficient documentation

## 2017-10-23 NOTE — Progress Notes (Signed)
Radiation Oncology Follow up Note  Name: Mary Brady   Date:   10/23/2017 MRN:  [id number] DOB: [birth year]    This 67 y.o. female presents to the clinic today for 3-1/2 year follow-up status postleft chest wall peripheral lymphatic radiation therapy for a T2 N1 ER/PR positive HER-2/neu overexpressed invasive mammary carcinoma status post left modified radical mastectomy  REFERRING PROVIDER: Maryland Pink, MD  HPI: patient is a 67 year old female now seen out 3 and half years having completed left chest wall peripheral lymphatic radiation therapy for a T2 N1 ER/PR positive HER-2/neu overexpressed invasive mammary carcinoma status post left modified radical mastectomy. She is seen today in routine follow-up is doing well. She's had her port removed..she recently had a biopsy of the right breast showing luminal and stromal calcifications associated with microcysts and an involuted lobules no evidence of malignancy or atypia.her right breast has been routinely imaged and I have reviewed her latest imaging. She's currently on arimadex tongue that well without side effect.  COMPLICATIONS OF TREATMENT: none  FOLLOW UP COMPLIANCE: keeps appointments   PHYSICAL EXAM:  BP (P) 120/75 (BP Location: Right Arm, Patient Position: Sitting)   Pulse (P) 78   Temp (P) 98.2 F (36.8 C) (Tympanic)   Wt (P) 221 lb 7.2 oz (100.4 kg)   BMI (P) 35.74 kg/m  Patient is status post left modified radical mastectomy. Chest wall is clear without evidence of nodularity or mass. No evidence of lymphedema in her left upper extremity is noted right breast is free of dominant mass or nodularity in 2 positions examined.Well-developed well-nourished patient in NAD. HEENT reveals PERLA, EOMI, discs not visualized.  Oral cavity is clear. No oral mucosal lesions are identified. Neck is clear without evidence of cervical or supraclavicular adenopathy. Lungs are clear to A&P. Cardiac examination is essentially unremarkable with  regular rate and rhythm without murmur rub or thrill. Abdomen is benign with no organomegaly or masses noted. Motor sensory and DTR levels are equal and symmetric in the upper and lower extremities. Cranial nerves II through XII are grossly intact. Proprioception is intact. No peripheral adenopathy or edema is identified. No motor or sensory levels are noted. Crude visual fields are within normal range.  RADIOLOGY RESULTS: recent mammograms from August of her right breast are reviewed and  ndometrial to calcifications that were biopsied and proved to be benign.  PLAN present time she continues to do well with no evidence of disease. I'm please were overall progress. I've asked to see her back in 1 year for follow-up and then will discontinue follow-up care. Patient is to call anytime with any concerns. She continues on arimadex without side effect.  I would like to take this opportunity to thank you for allowing me to participate in the care of your patient.Noreene Filbert, MD

## 2017-10-30 ENCOUNTER — Ambulatory Visit
Admission: RE | Admit: 2017-10-30 | Discharge: 2017-10-30 | Disposition: A | Discharge status: Home / Self Care | Payer: Medicare Other | Source: Ambulatory Visit | Attending: Urgent Care | Admitting: Urgent Care

## 2017-10-30 DIAGNOSIS — C50912 Malignant neoplasm of unspecified site of left female breast: Secondary | ICD-10-CM | POA: Insufficient documentation

## 2017-10-30 DIAGNOSIS — Z170 Estrogen receptor positive status [ER+]: Secondary | ICD-10-CM | POA: Diagnosis present

## 2017-10-30 DIAGNOSIS — Z79811 Long term (current) use of aromatase inhibitors: Secondary | ICD-10-CM | POA: Insufficient documentation

## 2018-01-13 NOTE — Discharge Instructions (Signed)

## 2018-01-15 ENCOUNTER — Other Ambulatory Visit: Payer: Self-pay

## 2018-01-20 ENCOUNTER — Ambulatory Visit: Payer: Medicare Other | Admitting: Anesthesiology

## 2018-01-20 ENCOUNTER — Ambulatory Visit
Admission: RE | Admit: 2018-01-20 | Discharge: 2018-01-20 | Disposition: A | Discharge status: Home / Self Care | Payer: Medicare Other | Attending: Ophthalmology | Admitting: Ophthalmology

## 2018-01-20 ENCOUNTER — Encounter
Admission: RE | Disposition: A | Discharge status: Home / Self Care | Payer: Self-pay | Source: Home / Self Care | Attending: Ophthalmology

## 2018-01-20 DIAGNOSIS — Z923 Personal history of irradiation: Secondary | ICD-10-CM | POA: Insufficient documentation

## 2018-01-20 DIAGNOSIS — Z79899 Other long term (current) drug therapy: Secondary | ICD-10-CM | POA: Diagnosis not present

## 2018-01-20 DIAGNOSIS — E119 Type 2 diabetes mellitus without complications: Secondary | ICD-10-CM | POA: Diagnosis not present

## 2018-01-20 DIAGNOSIS — Z9221 Personal history of antineoplastic chemotherapy: Secondary | ICD-10-CM | POA: Diagnosis not present

## 2018-01-20 DIAGNOSIS — Z7984 Long term (current) use of oral hypoglycemic drugs: Secondary | ICD-10-CM | POA: Diagnosis not present

## 2018-01-20 DIAGNOSIS — E039 Hypothyroidism, unspecified: Secondary | ICD-10-CM | POA: Diagnosis not present

## 2018-01-20 DIAGNOSIS — I10 Essential (primary) hypertension: Secondary | ICD-10-CM | POA: Insufficient documentation

## 2018-01-20 DIAGNOSIS — Z853 Personal history of malignant neoplasm of breast: Secondary | ICD-10-CM | POA: Insufficient documentation

## 2018-01-20 DIAGNOSIS — H2512 Age-related nuclear cataract, left eye: Secondary | ICD-10-CM | POA: Insufficient documentation

## 2018-01-20 DIAGNOSIS — F329 Major depressive disorder, single episode, unspecified: Secondary | ICD-10-CM | POA: Insufficient documentation

## 2018-01-20 DIAGNOSIS — E7800 Pure hypercholesterolemia, unspecified: Secondary | ICD-10-CM | POA: Diagnosis not present

## 2018-01-20 HISTORY — PX: CATARACT EXTRACTION W/PHACO: SHX586

## 2018-01-20 LAB — GLUCOSE, CAPILLARY
Glucose-Capillary: 100 mg/dL — ABNORMAL HIGH (ref 70–99)
Glucose-Capillary: 86 mg/dL (ref 70–99)

## 2018-01-20 SURGERY — PHACOEMULSIFICATION, CATARACT, WITH IOL INSERTION
Anesthesia: Monitor Anesthesia Care | Site: Eye | Laterality: Left

## 2018-01-20 MED ORDER — MOXIFLOXACIN HCL 0.5 % OP SOLN
OPHTHALMIC | Status: DC | PRN
  Administered 2018-01-20: 0.2 mL via OPHTHALMIC

## 2018-01-20 MED ORDER — LIDOCAINE HCL (PF) 2 % IJ SOLN
INTRAOCULAR | Status: DC | PRN
  Administered 2018-01-20: .5 mL

## 2018-01-20 MED ORDER — EPINEPHRINE PF 1 MG/ML IJ SOLN
INTRAOCULAR | Status: DC | PRN
  Administered 2018-01-20: 61 mL via OPHTHALMIC

## 2018-01-20 MED ORDER — MIDAZOLAM HCL 2 MG/2ML IJ SOLN
INTRAMUSCULAR | Status: DC | PRN
  Administered 2018-01-20 (×2): 1 mg via INTRAVENOUS

## 2018-01-20 MED ORDER — ACETAMINOPHEN 160 MG/5ML PO SOLN: 325.0000 mg | Freq: Once | ORAL | Status: DC

## 2018-01-20 MED ORDER — LACTATED RINGERS IV SOLN: INTRAVENOUS | Status: DC

## 2018-01-20 MED ORDER — BRIMONIDINE TARTRATE-TIMOLOL 0.2-0.5 % OP SOLN
OPHTHALMIC | Status: DC | PRN
  Administered 2018-01-20: 1 [drp] via OPHTHALMIC

## 2018-01-20 MED ORDER — MOXIFLOXACIN HCL 0.5 % OP SOLN
1.0000 [drp] | OPHTHALMIC | Status: DC | PRN
  Administered 2018-01-20 (×2): 1 [drp] via OPHTHALMIC

## 2018-01-20 MED ORDER — ARMC OPHTHALMIC DILATING DROPS
1.0000 "application " | OPHTHALMIC | Status: DC | PRN
  Administered 2018-01-20 (×3): 1 via OPHTHALMIC

## 2018-01-20 MED ORDER — NA HYALUR & NA CHOND-NA HYALUR 0.4-0.35 ML IO KIT
PACK | INTRAOCULAR | Status: DC | PRN
  Administered 2018-01-20: 1 mL via INTRAOCULAR

## 2018-01-20 MED ORDER — TETRACAINE HCL 0.5 % OP SOLN
1.0000 [drp] | OPHTHALMIC | Status: DC | PRN
  Administered 2018-01-20 (×3): 1 [drp] via OPHTHALMIC

## 2018-01-20 MED ORDER — ACETAMINOPHEN 325 MG PO TABS: 325.0000 mg | ORAL_TABLET | Freq: Once | ORAL | Status: DC

## 2018-01-20 SURGICAL SUPPLY — 27 items
CANNULA ANT/CHMB 27G (MISCELLANEOUS) ×1 IMPLANT
CANNULA ANT/CHMB 27GA (MISCELLANEOUS) ×3 IMPLANT
GLOVE SURG LX 7.5 STRW (GLOVE) ×2
GLOVE SURG LX STRL 7.5 STRW (GLOVE) ×1 IMPLANT
GLOVE SURG TRIUMPH 8.0 PF LTX (GLOVE) ×3 IMPLANT
GOWN STRL REUS W/ TWL LRG LVL3 (GOWN DISPOSABLE) ×2 IMPLANT
GOWN STRL REUS W/TWL LRG LVL3 (GOWN DISPOSABLE) ×4
LENS IOL ACRSF IQ ULTRA 19.0 (Intraocular Lens) IMPLANT
LENS IOL ACRYSOF IQ 19.0 (Intraocular Lens) ×3 IMPLANT
MARKER SKIN DUAL TIP RULER LAB (MISCELLANEOUS) ×3 IMPLANT
NDL FILTER BLUNT 18X1 1/2 (NEEDLE) ×1 IMPLANT
NDL RETROBULBAR .5 NSTRL (NEEDLE) IMPLANT
NEEDLE FILTER BLUNT 18X 1/2SAF (NEEDLE) ×2
NEEDLE FILTER BLUNT 18X1 1/2 (NEEDLE) ×1 IMPLANT
PACK CATARACT BRASINGTON (MISCELLANEOUS) ×3 IMPLANT
PACK EYE AFTER SURG (MISCELLANEOUS) ×3 IMPLANT
PACK OPTHALMIC (MISCELLANEOUS) ×3 IMPLANT
RING MALYGIN 7.0 (MISCELLANEOUS) IMPLANT
SUT ETHILON 10-0 CS-B-6CS-B-6 (SUTURE)
SUT VICRYL  9 0 (SUTURE)
SUT VICRYL 9 0 (SUTURE) IMPLANT
SUTURE EHLN 10-0 CS-B-6CS-B-6 (SUTURE) IMPLANT
SYR 3ML LL SCALE MARK (SYRINGE) ×3 IMPLANT
SYR 5ML LL (SYRINGE) ×3 IMPLANT
SYR TB 1ML LUER SLIP (SYRINGE) ×3 IMPLANT
WATER STERILE IRR 500ML POUR (IV SOLUTION) ×3 IMPLANT
WIPE NON LINTING 3.25X3.25 (MISCELLANEOUS) ×3 IMPLANT

## 2018-01-20 NOTE — Transfer of Care (Signed)
Immediate Anesthesia Transfer of Care Note  Patient: Mary Brady  Procedure(s) Performed: CATARACT EXTRACTION PHACO AND INTRAOCULAR LENS PLACEMENT (IOC)  LEFT DIABETIC (Left Eye)  Patient Location: PACU  Anesthesia Type: MAC  Level of Consciousness: awake, alert  and patient cooperative  Airway and Oxygen Therapy: Patient Spontanous Breathing and Patient connected to supplemental oxygen  Post-op Assessment: Post-op Vital signs reviewed, Patient's Cardiovascular Status Stable, Respiratory Function Stable, Patent Airway and No signs of Nausea or vomiting  Post-op Vital Signs: Reviewed and stable  Complications: No apparent anesthesia complications

## 2018-01-20 NOTE — Op Note (Signed)
OPERATIVE NOTE  Mary Brady [id number] 01/20/2018   PREOPERATIVE DIAGNOSIS:  Nuclear sclerotic cataract left eye. H25.12   POSTOPERATIVE DIAGNOSIS:    Nuclear sclerotic cataract left eye.     PROCEDURE:  Phacoemusification with posterior chamber intraocular lens placement of the left eye   LENS:   Implant Name Type Inv. Item Serial No. Manufacturer Lot No. LRB No. Used  LENS IOL ACRYSOF IQ 19.0 - [id number] Intraocular Lens LENS IOL ACRYSOF IQ 19.0 [id number] ALCON  Left 1        ULTRASOUND TIME: 18  % of 0 minutes 47 seconds, CDE 8.8  SURGEON:  Wyonia Hough, MD   ANESTHESIA:  Topical with tetracaine drops and 2% Xylocaine jelly, augmented with 1% preservative-free intracameral lidocaine.    COMPLICATIONS:  None.   DESCRIPTION OF PROCEDURE:  The patient was identified in the holding room and transported to the operating room and placed in the supine position under the operating microscope.  The left eye was identified as the operative eye and it was prepped and draped in the usual sterile ophthalmic fashion.   A 1 millimeter clear-corneal paracentesis was made at the 1:30 position.  0.5 ml of preservative-free 1% lidocaine was injected into the anterior chamber.  The anterior chamber was filled with Viscoat viscoelastic.  A 2.4 millimeter keratome was used to make a near-clear corneal incision at the 10:30 position.  .  A curvilinear capsulorrhexis was made with a cystotome and capsulorrhexis forceps.  Balanced salt solution was used to hydrodissect and hydrodelineate the nucleus.   Phacoemulsification was then used in stop and chop fashion to remove the lens nucleus and epinucleus.  The remaining cortex was then removed using the irrigation and aspiration handpiece. Provisc was then placed into the capsular bag to distend it for lens placement.  A lens was then injected into the capsular bag.  The remaining viscoelastic was aspirated.   Wounds were hydrated with  balanced salt solution.  The anterior chamber was inflated to a physiologic pressure with balanced salt solution.  No wound leaks were noted. Vigamox 0.2 ml of a 1mg  per ml solution was injected into the anterior chamber for a dose of 0.2 mg of intracameral antibiotic at the completion of the case.   Timolol and Brimonidine drops were applied to the eye.  The patient was taken to the recovery room in stable condition without complications of anesthesia or surgery.  [name] 01/20/2018, 7:54 AM

## 2018-01-20 NOTE — Anesthesia Postprocedure Evaluation (Signed)
Anesthesia Post Note  Patient: Mary Brady  Procedure(s) Performed: CATARACT EXTRACTION PHACO AND INTRAOCULAR LENS PLACEMENT (IOC)  LEFT DIABETIC (Left Eye)  Patient location during evaluation: PACU Anesthesia Type: MAC Level of consciousness: awake and alert and oriented Pain management: satisfactory to patient Vital Signs Assessment: post-procedure vital signs reviewed and stable Respiratory status: spontaneous breathing, nonlabored ventilation and respiratory function stable Cardiovascular status: blood pressure returned to baseline and stable Postop Assessment: Adequate PO intake and No signs of nausea or vomiting Anesthetic complications: no    Raliegh Ip

## 2018-01-20 NOTE — Anesthesia Procedure Notes (Signed)
Procedure Name: MAC Date/Time: 01/20/2018 7:39 AM Performed by: Lerry Liner, CRNA Pre-anesthesia Checklist: Patient identified, Emergency Drugs available, Suction available, Patient being monitored and Timeout performed Oxygen Delivery Method: Nasal cannula

## 2018-01-20 NOTE — Anesthesia Preprocedure Evaluation (Signed)
Anesthesia Evaluation  Patient identified by MRN, date of birth, ID band Patient awake    Reviewed: Allergy & Precautions, H&P , NPO status , Patient's Chart, lab work & pertinent test results  Airway Mallampati: II  TM Distance: >3 FB Neck ROM: full    Dental no notable dental hx.    Pulmonary    Pulmonary exam normal breath sounds clear to auscultation       Cardiovascular hypertension, Normal cardiovascular exam Rhythm:regular Rate:Normal     Neuro/Psych PSYCHIATRIC DISORDERS    GI/Hepatic   Endo/Other  diabetesHypothyroidism   Renal/GU      Musculoskeletal   Abdominal   Peds  Hematology   Anesthesia Other Findings   Reproductive/Obstetrics                             Anesthesia Physical Anesthesia Plan  ASA: II  Anesthesia Plan: MAC   Post-op Pain Management:    Induction:   PONV Risk Score and Plan: 2 and Treatment may vary due to age or medical condition and Midazolam  Airway Management Planned:   Additional Equipment:   Intra-op Plan:   Post-operative Plan:   Informed Consent: I have reviewed the patients History and Physical, chart, labs and discussed the procedure including the risks, benefits and alternatives for the proposed anesthesia with the patient or authorized representative who has indicated his/her understanding and acceptance.     Plan Discussed with: CRNA  Anesthesia Plan Comments:         Anesthesia Quick Evaluation

## 2018-01-20 NOTE — H&P (Signed)

## 2018-01-21 ENCOUNTER — Encounter: Payer: Self-pay | Admitting: Ophthalmology

## 2018-02-12 ENCOUNTER — Other Ambulatory Visit: Payer: Medicare Other

## 2018-02-12 ENCOUNTER — Ambulatory Visit: Payer: Medicare Other | Admitting: Hematology and Oncology

## 2018-02-12 ENCOUNTER — Encounter: Payer: Self-pay | Admitting: Hematology and Oncology

## 2018-02-12 ENCOUNTER — Inpatient Hospital Stay: Payer: Medicare Other | Attending: Hematology and Oncology

## 2018-02-12 ENCOUNTER — Inpatient Hospital Stay: Payer: Medicare Other | Admitting: Hematology and Oncology

## 2018-02-12 VITALS — BP 106/70 | HR 68 | Temp 98.7°F | Resp 20 | Wt 220.1 lb

## 2018-02-12 DIAGNOSIS — C50112 Malignant neoplasm of central portion of left female breast: Secondary | ICD-10-CM | POA: Diagnosis present

## 2018-02-12 DIAGNOSIS — M85859 Other specified disorders of bone density and structure, unspecified thigh: Secondary | ICD-10-CM | POA: Insufficient documentation

## 2018-02-12 DIAGNOSIS — M85851 Other specified disorders of bone density and structure, right thigh: Secondary | ICD-10-CM

## 2018-02-12 DIAGNOSIS — Z170 Estrogen receptor positive status [ER+]: Secondary | ICD-10-CM | POA: Diagnosis not present

## 2018-02-12 DIAGNOSIS — M8580 Other specified disorders of bone density and structure, unspecified site: Secondary | ICD-10-CM | POA: Insufficient documentation

## 2018-02-12 DIAGNOSIS — C50912 Malignant neoplasm of unspecified site of left female breast: Secondary | ICD-10-CM

## 2018-02-12 LAB — CBC WITH DIFFERENTIAL/PLATELET
Abs Immature Granulocytes: 0.03 10*3/uL (ref 0.00–0.07)
Basophils Absolute: 0.1 10*3/uL (ref 0.0–0.1)
Basophils Relative: 1 %
Eosinophils Absolute: 0.1 10*3/uL (ref 0.0–0.5)
Eosinophils Relative: 1 %
HCT: 38.1 % (ref 36.0–46.0)
Hemoglobin: 12.3 g/dL (ref 12.0–15.0)
Immature Granulocytes: 0 %
Lymphocytes Relative: 16 %
Lymphs Abs: 1.4 10*3/uL (ref 0.7–4.0)
MCH: 29.4 pg (ref 26.0–34.0)
MCHC: 32.3 g/dL (ref 30.0–36.0)
MCV: 91.1 fL (ref 80.0–100.0)
Monocytes Absolute: 0.5 10*3/uL (ref 0.1–1.0)
Monocytes Relative: 6 %
Neutro Abs: 7 10*3/uL (ref 1.7–7.7)
Neutrophils Relative %: 76 %
Platelets: 381 10*3/uL (ref 150–400)
RBC: 4.18 MIL/uL (ref 3.87–5.11)
RDW: 14.5 % (ref 11.5–15.5)
WBC: 9.1 10*3/uL (ref 4.0–10.5)
nRBC: 0 % (ref 0.0–0.2)

## 2018-02-12 LAB — COMPREHENSIVE METABOLIC PANEL
ALT: 26 U/L (ref 0–44)
AST: 33 U/L (ref 15–41)
Albumin: 4 g/dL (ref 3.5–5.0)
Alkaline Phosphatase: 96 U/L (ref 38–126)
Anion gap: 11 (ref 5–15)
BUN: 21 mg/dL (ref 8–23)
CO2: 26 mmol/L (ref 22–32)
Calcium: 9.5 mg/dL (ref 8.9–10.3)
Chloride: 100 mmol/L (ref 98–111)
Creatinine, Ser: 1.1 mg/dL — ABNORMAL HIGH (ref 0.44–1.00)
GFR calc Af Amer: >60 mL/min (ref >60)
GFR calc non Af Amer: 52 mL/min — ABNORMAL LOW (ref >60)
Glucose, Bld: 164 mg/dL — ABNORMAL HIGH (ref 70–99)
Potassium: 4.3 mmol/L (ref 3.5–5.1)
Sodium: 137 mmol/L (ref 135–145)
Total Bilirubin: 0.7 mg/dL (ref 0.3–1.2)
Total Protein: 7.6 g/dL (ref 6.5–8.1)

## 2018-02-12 NOTE — Progress Notes (Signed)
Pt here for follow up. Reports Handicap Sticker runs out in July and is requesting renewal d/t not being able to walk long distances. Denies any other concerns at this time.

## 2018-02-12 NOTE — Patient Instructions (Signed)

## 2018-02-12 NOTE — Progress Notes (Signed)
Mary Brady is a 68 y.o. female with stage IIB left breast cancer who is seen for 6 month assessment.  HPI: The patient was last seen in the medical oncology clinic on 08/11/2017.  At that time, she was doing well on Femara.  Exam was stable.  Hemoglobin was 11.3.  CA27.29 was normal.  Right diagnostic mammogram on 08/29/2017 revealed a group of indeterminate microcalcifications measuring 4 x 4 x 3 mm in the lateral slight upper right breast.  Biopsy on 10/08/2017 revealed luminal and stromal calcifications associated with microcysts and involuted lobules.  There was incidental artery with calcification.  There was no atypia or malignancy.  Bone density on 10/30/2017 revealed osteopenia with a T-score of -1.2 in the right femoral neck.   During the interim, notes no breast concerns.  She notes issues with cataracts.  She describes having difficulty seeing in November and December.  She underwent left cataract surgery on 01/20/2018.   Past Medical History:  Diagnosis Date  . Arthritis   . Breast cancer ([place]) 2015   LT MASTECTOMY  . Cataract   . Depression   . Diabetes mellitus without complication ([place])   . Dysfunctional uterine bleeding   . History of colonic polyps   . Hypertension   . Hypothyroidism 07/09/2007  . Personal history of chemotherapy 2015   BREAST CA  . Personal history of radiation therapy 2015   BREAST CA    Past Surgical History:  Procedure Laterality Date  . BREAST BIOPSY Left 03/2013   positive  . BREAST BIOPSY Right 10/08/2017   rt stereo x clip path pending  . CATARACT EXTRACTION Bilateral 02/08/2013  . CATARACT EXTRACTION W/PHACO Left 01/20/2018   Procedure: CATARACT EXTRACTION PHACO AND INTRAOCULAR LENS PLACEMENT ([place])  LEFT DIABETIC;  Surgeon: Leandrew Koyanagi, MD;  Location: [facility];  Service: Ophthalmology;  Laterality: Left;  Diabetic  .  COLONOSCOPY    . COLONOSCOPY WITH PROPOFOL N/A 12/01/2014   Procedure: COLONOSCOPY WITH PROPOFOL;  Surgeon: Manya Silvas, MD;  Location: [facility] ENDOSCOPY;  Service: Endoscopy;  Laterality: N/A;  . ENDOMETRIAL BIOPSY    . MASTECTOMY MODIFIED RADICAL Left 10/05/2013   BREAST CA  . SKIN BIOPSY      Family History  Problem Relation Age of Onset  . Cancer Other   . Cancer Cousin   . Breast cancer Cousin     Social History:  reports that she has never smoked. She has never used smokeless tobacco. She reports that she does not drink alcohol or use drugs.  She is a retired Sales executive.  She lives in [place].  The patient is alone today.  Allergies:  Allergies  Allergen Reactions  . Demerol [Meperidine] Other (See Comments)    "Hyper"  . Penicillins Rash    Current Medications: Current Outpatient Medications  Medication Sig Dispense Refill  . ACCU-CHEK AVIVA PLUS test strip USE 2 (TWO) TIMES DAILY    . acetaminophen (TYLENOL) 500 MG tablet Take 1,000 mg by mouth every 6 (six) hours as needed (for pain).    . bisoprolol-hydrochlorothiazide (ZIAC) 5-6.25 MG per tablet Take 1 tablet by mouth at bedtime.    . Calcium Carb-Cholecalciferol 500-400 MG-UNIT TABS Take 1 tablet by mouth 2 (two) times daily.    . cetirizine (ZYRTEC) 10 MG tablet Take 10 mg by mouth daily.     Marland Kitchen FLUoxetine (PROZAC) 20 MG tablet  Take 20 mg by mouth daily.    Marland Kitchen glimepiride (AMARYL) 4 MG tablet Take 4 mg by mouth 2 (two) times daily.    Marland Kitchen KLOR-CON M20 20 MEQ tablet TAKE 1 TABLET BY MOUTH ONCE A DAY 30 tablet 6  . levothyroxine (SYNTHROID, LEVOTHROID) 175 MCG tablet Take 175 mcg by mouth daily before breakfast.    . magnesium chloride (SLOW-MAG) 64 MG TBEC SR tablet Take 1 tablet by mouth 2 (two) times daily.    . pioglitazone (ACTOS) 15 MG tablet Take 15 mg by mouth daily.    . simvastatin (ZOCOR) 20 MG tablet Take 20 mg by mouth at bedtime.    . TRULICITY 1.5 OI/3.7CW SOPN INJECT 1.5 MG SUBCUTANEOUSLY  EVERY 7 (SEVEN) DAYS    . letrozole (FEMARA) 2.5 MG tablet TAKE 1 TABLET BY MOUTH EVERY DAY 90 tablet 1   No current facility-administered medications for this visit.     Review of Systems  Constitutional: Negative.  Negative for chills, diaphoresis, fever, malaise/fatigue and weight loss (stable).  HENT: Negative.  Negative for congestion, ear pain, nosebleeds, sinus pain and sore throat.   Eyes: Negative.  Negative for double vision, photophobia and pain.       Interval left cataract surgery.  Respiratory: Negative.  Negative for cough, hemoptysis, sputum production and shortness of breath.   Cardiovascular: Negative.  Negative for chest pain, palpitations, orthopnea, leg swelling and PND.  Gastrointestinal: Negative.  Negative for abdominal pain, blood in stool, constipation, diarrhea, melena, nausea and vomiting.  Genitourinary: Negative.  Negative for dysuria, frequency, hematuria and urgency.  Musculoskeletal: Negative.  Negative for back pain, falls, joint pain, myalgias and neck pain.  Skin: Negative.  Negative for itching and rash.  Neurological: Negative for dizziness, tremors, speech change, focal weakness, weakness and headaches.  Endo/Heme/Allergies: Does not bruise/bleed easily.       Diabetes.  Thyroid disease on Synthroid.  Psychiatric/Behavioral: Negative.  Negative for depression and memory loss. The patient is not nervous/anxious and does not have insomnia.   All other systems reviewed and are negative.  Physical Exam: Blood pressure 106/70, pulse 68, temperature 98.7 F (37.1 C), temperature source Oral, resp. rate 20, weight 220 lb 2.1 oz (99.9 kg), SpO2 98 %. GENERAL:  Well developed, well nourished, woman sitting comfortably in the exam room in no acute distress. MENTAL STATUS:  Alert and oriented to person, place and time. HEAD:  Short gray hair.  Normocephalic, atraumatic, face symmetric, no Cushingoid features. EYES:  Brown eyes s/p left cataract surgery.   Pupils equal round and reactive to light and accomodation.  No conjunctivitis or scleral icterus. ENT:  Oropharynx clear without lesion.  Tongue normal. Mucous membranes moist.  RESPIRATORY:  Clear to auscultation without rales, wheezes or rhonchi. CARDIOVASCULAR:  Regular rate and rhythm without murmur, rub or gallop. ABDOMEN:  Soft, non-tender, with active bowel sounds, and no hepatosplenomegaly.  No masses. SKIN:  No rashes, ulcers or lesions. EXTREMITIES: No edema, no skin discoloration or tenderness.  No palpable cords. LYMPH NODES: No palpable cervical, supraclavicular, axillary or inguinal adenopathy  NEUROLOGICAL: Unremarkable. PSYCH:  Appropriate.    Appointment on 02/12/2018  Component Date Value Ref Range Status  . CA 27.29 02/12/2018 29.8  0.0 - 38.6 U/mL Final   Comment: (NOTE) Siemens Centaur Immunochemiluminometric Methodology [facility]) Values obtained with different assay methods or kits cannot be used interchangeably. Results cannot be interpreted as absolute evidence of the presence or absence of malignant disease. Performed At: [facility]  [place], [place] [id number] Rush Farmer MD QQ:[id number]   . Sodium 02/12/2018 137  135 - 145 mmol/L Final  . Potassium 02/12/2018 4.3  3.5 - 5.1 mmol/L Final  . Chloride 02/12/2018 100  98 - 111 mmol/L Final  . CO2 02/12/2018 26  22 - 32 mmol/L Final  . Glucose, Bld 02/12/2018 164* 70 - 99 mg/dL Final  . BUN 02/12/2018 21  8 - 23 mg/dL Final  . Creatinine, Ser 02/12/2018 1.10* 0.44 - 1.00 mg/dL Final  . Calcium 02/12/2018 9.5  8.9 - 10.3 mg/dL Final  . Total Protein 02/12/2018 7.6  6.5 - 8.1 g/dL Final  . Albumin 02/12/2018 4.0  3.5 - 5.0 g/dL Final  . AST 02/12/2018 33  15 - 41 U/L Final  . ALT 02/12/2018 26  0 - 44 U/L Final  . Alkaline Phosphatase 02/12/2018 96  38 - 126 U/L Final  . Total Bilirubin 02/12/2018 0.7  0.3 - 1.2 mg/dL Final  . GFR calc non Af Amer 02/12/2018 52* >60 mL/min Final  .  GFR calc Af Amer 02/12/2018 >60  >60 mL/min Final  . Anion gap 02/12/2018 11  5 - 15 Final   Performed at [facility], [address] [place], [place] [address]  . WBC 02/12/2018 9.1  4.0 - 10.5 K/uL Final  . RBC 02/12/2018 4.18  3.87 - 5.11 MIL/uL Final  . Hemoglobin 02/12/2018 12.3  12.0 - 15.0 g/dL Final  . HCT 02/12/2018 38.1  36.0 - 46.0 % Final  . MCV 02/12/2018 91.1  80.0 - 100.0 fL Final  . MCH 02/12/2018 29.4  26.0 - 34.0 pg Final  . MCHC 02/12/2018 32.3  30.0 - 36.0 g/dL Final  . RDW 02/12/2018 14.5  11.5 - 15.5 % Final  . Platelets 02/12/2018 381  150 - 400 K/uL Final  . nRBC 02/12/2018 0.0  0.0 - 0.2 % Final  . Neutrophils Relative % 02/12/2018 76  % Final  . Neutro Abs 02/12/2018 7.0  1.7 - 7.7 K/uL Final  . Lymphocytes Relative 02/12/2018 16  % Final  . Lymphs Abs 02/12/2018 1.4  0.7 - 4.0 K/uL Final  . Monocytes Relative 02/12/2018 6  % Final  . Monocytes Absolute 02/12/2018 0.5  0.1 - 1.0 K/uL Final  . Eosinophils Relative 02/12/2018 1  % Final  . Eosinophils Absolute 02/12/2018 0.1  0.0 - 0.5 K/uL Final  . Basophils Relative 02/12/2018 1  % Final  . Basophils Absolute 02/12/2018 0.1  0.0 - 0.1 K/uL Final  . Immature Granulocytes 02/12/2018 0  % Final  . Abs Immature Granulocytes 02/12/2018 0.03  0.00 - 0.07 K/uL Final   Performed at [facility], [address] [place], [place] [address]    Assessment:  Mary Brady is a 68 y.o. female with clinical stage IIB Her2/neu + left breast cancer s/p mastectomy and axillary lymph node dissection after neoadjuvant chemotherapy.  Ultrasound guided breast biopsy on 04/14/2015 revealed grade III invasive mammary carcinoma with no special type. Lymphovascular invasion was present.  Left axillary lymph node biopsy revealed metastatic carcinoma. Tumor was ER positive (1-5%), PR positive (1%) and HER-2/neu positive by FISH.  Clinical stage was T2N1M0.  Mammogram and ultrasound on 04/07/2013 revealed a 4  cm irregular mass located within the subareolar portion of the left breast with nipple retraction and thickening of the skin of the nipple. There were at least 3 abnormal appearing level I left axillary lymph nodes.  PET  scan on 05/07/2013 revealed no evidence of metastatic disease.    She enrolled on NSABP B-52 and randomized to carboplatin, Taxotere, Herceptin and Perjeta. She received 6 cycles of therapy from 05/10/2013 - 08/23/2013. She began maintenance Herceptin on 09/13/2013 and completed a year of therapy in 04/2014.  Left modified radical mastectomy and axillary lymph node dissection on 10/05/2013 revealed residual focal grade III invasive carcinoma, with microscopic foci, with lymphovascular invasion.  Twelve lymph nodes were negative for malignancy.  Pathologic stage was ypT1a(m) ypN0.  She began radiation therapy in 11/2013. She was complicated by cellulitis of the left chest wall with staph aureus. She began Femara 2.5 mg a day in 02/2014.    CA27.29 has been followed:  22.9 on 10/04/2013, 15.7 on 08/01/2014, 23.5 on 09/21/2014, 15.2 on 10/31/2014, 16.7 on 03/04/2016, 20.8 on 08/08/2016, 19.6 on 02/10/2017, 23.8 on 08/11/2017, and 29.8 on 02/12/2018.  Right sided mammogram on 08/21/2016 revealed probably benign right breast calcifications.  Right diagnostic mammogram on 08/29/2017 revealed a group of indeterminate microcalcifications measuring 4 x 4 x 3 mm in the lateral slight upper right breast.  Biopsy on 10/08/2017 revealed luminal and stromal calcifications associated with microcysts and involuted lobules.  There was incidental artery with calcification.  There was no atypia or malignancy.  Bone density on 10/30/2015 was normal with a T-score of -0.8 in the right femoral neck.  Bone density on 10/30/2017 revealed osteopenia with a T-score of -1.2 in the right femoral neck.   Symptomatically, she is doing well.  Exam is stable.  CA27.29 is normal.  Plan: 1.   Labs today:  CBC with  diff, CMP, CA27.29. 2.   Stage IIB left breast cancer  Clinically, she is doing well.  Review interval mammogram and biopsy- benign.  She is tolerating Femara well.  Discuss BCI testing and benefit of 5 versus 10 years of adjuvant endocrine therapy.   Information provided. 3.   Osteopenia  Discuss calcium, vitamin D and exercise.  Discuss Prolia.  Side effects reviewed.  Information provided. 4.   Walking limitation  Renew handicap parking permit. 5.   RTC in 6 months for MD assessment, labs (CBC with diff, CMP, CA27.29), and review of mammogram and bone density.  I discussed the assessment and treatment plan with the patient.  The patient was provided an opportunity to ask questions and all were answered.  The patient agreed with the plan and demonstrated an understanding of the instructions.  The patient was advised to call back or seek an in person evaluation if the symptoms worsen or if the condition fails to improve as anticipated.   Lequita Asal, MD  02/12/2018, 4:35 PM

## 2018-02-13 LAB — CANCER ANTIGEN 27.29: CA 27.29: 29.8 U/mL (ref 0.0–38.6)

## 2018-04-22 ENCOUNTER — Other Ambulatory Visit: Payer: Self-pay | Admitting: Hematology and Oncology

## 2018-08-11 NOTE — Progress Notes (Signed)
[facility]  [address], [address] [place], [place] [address] Phone: [phone]  Fax: [phone]   Clinic Day:  08/13/2018  Referring physician: Maryland Pink, MD  Chief Complaint: Mary Brady is a 68 y.o. female with stage IIB left breast cancer who is seen for 6 month assessment.  HPI: The patient was last seen in the medical oncology clinic on 02/12/2018. At that time, she was doing well. Exam was stable. CA27.29 was normal. She continued Femara.   During the interim, the patient is doing "pretty good". She is taking Femara. She is taking her calcium and vitamin D. She notes a sharp pain in chest near her sternum since last fall. She notes her vision has improved. Her diabetes is being well managed. She reports doing monthly breast exams.  She denies any breast concerns.   Past Medical History:  Diagnosis Date   Arthritis    Breast cancer ([place]) 2015   LT MASTECTOMY   Cataract    Depression    Diabetes mellitus without complication ([place])    Dysfunctional uterine bleeding    History of colonic polyps    Hypertension    Hypothyroidism 07/09/2007   Personal history of chemotherapy 2015   BREAST CA   Personal history of radiation therapy 2015   BREAST CA    Past Surgical History:  Procedure Laterality Date   BREAST BIOPSY Left 03/2013   positive   BREAST BIOPSY Right 10/08/2017   rt stereo x clip path pending   CATARACT EXTRACTION Bilateral 02/08/2013   CATARACT EXTRACTION W/PHACO Left 01/20/2018   Procedure: CATARACT EXTRACTION PHACO AND INTRAOCULAR LENS PLACEMENT ([place])  LEFT DIABETIC;  Surgeon: Leandrew Koyanagi, MD;  Location: [facility];  Service: Ophthalmology;  Laterality: Left;  Diabetic   COLONOSCOPY     COLONOSCOPY WITH PROPOFOL N/A 12/01/2014   Procedure: COLONOSCOPY WITH PROPOFOL;  Surgeon: Manya Silvas, MD;  Location: [facility] ENDOSCOPY;  Service: Endoscopy;  Laterality: N/A;   ENDOMETRIAL BIOPSY      MASTECTOMY MODIFIED RADICAL Left 10/05/2013   BREAST CA   SKIN BIOPSY      Family History  Problem Relation Age of Onset   Cancer Other    Cancer Cousin    Breast cancer Cousin     Social History:  reports that she has never smoked. She has never used smokeless tobacco. She reports that she does not drink alcohol or use drugs. She is a retired Sales executive.  She lives in [place]. The patient is alone today.  Allergies:  Allergies  Allergen Reactions   Demerol [Meperidine] Other (See Comments)    "Hyper"   Penicillins Rash    Current Medications: Current Outpatient Medications  Medication Sig Dispense Refill   acetaminophen (TYLENOL) 500 MG tablet Take 1,000 mg by mouth every 6 (six) hours as needed (for pain).     bisoprolol-hydrochlorothiazide (ZIAC) 5-6.25 MG per tablet Take 1 tablet by mouth at bedtime.     Calcium Carb-Cholecalciferol 500-400 MG-UNIT TABS Take 1 tablet by mouth 2 (two) times daily.     cetirizine (ZYRTEC) 10 MG tablet Take 10 mg by mouth daily.      FLUoxetine (PROZAC) 20 MG tablet Take 20 mg by mouth daily.     glimepiride (AMARYL) 4 MG tablet Take 4 mg by mouth 2 (two) times daily.     KLOR-CON M20 20 MEQ tablet TAKE 1 TABLET BY MOUTH ONCE A DAY 30 tablet 6   letrozole [facility])  2.5 MG tablet TAKE 1 TABLET BY MOUTH EVERY DAY 90 tablet 1   levothyroxine (SYNTHROID) 200 MCG tablet Take 200 mcg by mouth daily before breakfast.      magnesium chloride (SLOW-MAG) 64 MG TBEC SR tablet Take 1 tablet by mouth 2 (two) times daily.     meloxicam (MOBIC) 15 MG tablet Take 15 mg by mouth as needed.      pioglitazone (ACTOS) 30 MG tablet Take 30 mg by mouth daily.      simvastatin (ZOCOR) 20 MG tablet Take 20 mg by mouth at bedtime.     TRULICITY 1.5 WV/3.7TG SOPN INJECT 1.5 MG SUBCUTANEOUSLY EVERY 7 (SEVEN) DAYS     ACCU-CHEK AVIVA PLUS test strip USE 2 (TWO) TIMES DAILY     Lancets MISC Use 1 each 2 (two) times daily     No  current facility-administered medications for this visit.     Review of Systems  Constitutional: Negative.  Negative for chills, diaphoresis, fever, malaise/fatigue and weight loss (up 7 lbs).       Doing "pretty good".  HENT: Negative.  Negative for congestion, ear pain, nosebleeds, sinus pain and sore throat.   Eyes: Negative.  Negative for double vision, photophobia and pain.       Interval left cataract surgery (vision improved afterwards).  Respiratory: Negative.  Negative for cough, hemoptysis, sputum production and shortness of breath.   Cardiovascular: Positive for chest pain (near breast). Negative for palpitations, orthopnea, leg swelling and PND.  Gastrointestinal: Negative.  Negative for abdominal pain, blood in stool, constipation, diarrhea, melena, nausea and vomiting.  Genitourinary: Negative.  Negative for dysuria, frequency, hematuria and urgency.  Musculoskeletal: Negative.  Negative for back pain, falls, joint pain, myalgias and neck pain.  Skin: Negative.  Negative for itching and rash.  Neurological: Negative for dizziness, tremors, speech change, focal weakness, weakness and headaches.  Endo/Heme/Allergies: Does not bruise/bleed easily.       Diabetes.  Thyroid disease on Synthroid.  Psychiatric/Behavioral: Negative.  Negative for depression and memory loss. The patient is not nervous/anxious and does not have insomnia.   All other systems reviewed and are negative.  Performance status (ECOG): 0  Vitals Blood pressure 128/82, pulse 74, temperature 98.1 F (36.7 C), temperature source Tympanic, resp. rate 18, weight 227 lb 6.5 oz (103.1 kg), SpO2 99 %.  Physical Exam  Constitutional: She is oriented to person, place, and time. She appears well-developed and well-nourished. No distress.  HENT:  Head: Normocephalic and atraumatic.  Mouth/Throat: Oropharynx is clear and moist. No oropharyngeal exudate.  Short gray hair. Mask.  Eyes: Pupils are equal, round, and  reactive to light. Conjunctivae and EOM are normal. No scleral icterus.  Brown eyes s/p left cataract surgery.  Neck: Normal range of motion. Neck supple. No JVD present.  Cardiovascular: Normal rate, regular rhythm and normal heart sounds.  No murmur heard. Pulmonary/Chest: Effort normal and breath sounds normal. No respiratory distress. She has no wheezes. She has no rales. She exhibits no tenderness. Right breast exhibits no inverted nipple, no mass, no skin change and no tenderness. Breasts are asymmetrical (no erythema or nodularity).  Abdominal: Soft. Bowel sounds are normal. She exhibits no distension and no mass. There is no abdominal tenderness. There is no rebound and no guarding.  Musculoskeletal: Normal range of motion.        General: No tenderness or edema.  Lymphadenopathy:    She has no cervical adenopathy.    She has no axillary adenopathy.  Right: No supraclavicular adenopathy present.       Left: No supraclavicular adenopathy present.  Neurological: She is alert and oriented to person, place, and time. She has normal reflexes.  Skin: Skin is warm and dry. No rash noted. She is not diaphoretic. No erythema. No pallor.  Psychiatric: She has a normal mood and affect. Her behavior is normal. Judgment and thought content normal.  Nursing note and vitals reviewed.   Appointment on 08/13/2018  Component Date Value Ref Range Status   Sodium 08/13/2018 134* 135 - 145 mmol/L Final   Potassium 08/13/2018 3.7  3.5 - 5.1 mmol/L Final   Chloride 08/13/2018 104  98 - 111 mmol/L Final   CO2 08/13/2018 23  22 - 32 mmol/L Final   Glucose, Bld 08/13/2018 202* 70 - 99 mg/dL Final   BUN 08/13/2018 14  8 - 23 mg/dL Final   Creatinine, Ser 08/13/2018 0.88  0.44 - 1.00 mg/dL Final   Calcium 08/13/2018 8.8* 8.9 - 10.3 mg/dL Final   Total Protein 08/13/2018 7.4  6.5 - 8.1 g/dL Final   Albumin 08/13/2018 3.7  3.5 - 5.0 g/dL Final   AST 08/13/2018 21  15 - 41 U/L Final   ALT  08/13/2018 21  0 - 44 U/L Final   Alkaline Phosphatase 08/13/2018 97  38 - 126 U/L Final   Total Bilirubin 08/13/2018 0.2* 0.3 - 1.2 mg/dL Final   GFR calc non Af Amer 08/13/2018 >60  >60 mL/min Final   GFR calc Af Amer 08/13/2018 >60  >60 mL/min Final   Anion gap 08/13/2018 7  5 - 15 Final   Performed at [facility], [address], [place], [place] [address]   WBC 08/13/2018 7.4  4.0 - 10.5 K/uL Final   RBC 08/13/2018 4.11  3.87 - 5.11 MIL/uL Final   Hemoglobin 08/13/2018 11.6* 12.0 - 15.0 g/dL Final   HCT 08/13/2018 35.5* 36.0 - 46.0 % Final   MCV 08/13/2018 86.4  80.0 - 100.0 fL Final   MCH 08/13/2018 28.2  26.0 - 34.0 pg Final   MCHC 08/13/2018 32.7  30.0 - 36.0 g/dL Final   RDW 08/13/2018 14.3  11.5 - 15.5 % Final   Platelets 08/13/2018 319  150 - 400 K/uL Final   nRBC 08/13/2018 0.0  0.0 - 0.2 % Final   Neutrophils Relative % 08/13/2018 76  % Final   Neutro Abs 08/13/2018 5.6  1.7 - 7.7 K/uL Final   Lymphocytes Relative 08/13/2018 17  % Final   Lymphs Abs 08/13/2018 1.3  0.7 - 4.0 K/uL Final   Monocytes Relative 08/13/2018 5  % Final   Monocytes Absolute 08/13/2018 0.4  0.1 - 1.0 K/uL Final   Eosinophils Relative 08/13/2018 1  % Final   Eosinophils Absolute 08/13/2018 0.1  0.0 - 0.5 K/uL Final   Basophils Relative 08/13/2018 1  % Final   Basophils Absolute 08/13/2018 0.1  0.0 - 0.1 K/uL Final   Immature Granulocytes 08/13/2018 0  % Final   Abs Immature Granulocytes 08/13/2018 0.03  0.00 - 0.07 K/uL Final   Performed at [facility], [address] [place], [place] [address]    Assessment:  Mary Brady is a 68 y.o. female with clinical stage IIB Her2/neu + left breast cancer s/p mastectomy and axillary lymph node dissection after neoadjuvant chemotherapy.  Ultrasound guided breast biopsy on 04/14/2015 revealed grade III invasive mammary carcinoma with no special type. Lymphovascular invasion was present.  Left axillary  lymph node biopsy revealed metastatic carcinoma. Tumor was ER positive (1-5%), PR positive (1%) and HER-2/neu positive by FISH.  Clinical stage was T2N1M0.  Mammogram and ultrasound on 04/07/2013 revealed a 4 cm irregular mass located within the subareolar portion of the left breast with nipple retraction and thickening of the skin of the nipple. There were at least 3 abnormal appearing level I left axillary lymph nodes.  PET scan on 05/07/2013 revealed no evidence of metastatic disease.    She enrolled on NSABP B-52 and randomized to carboplatin, Taxotere, Herceptin and Perjeta. She received 6 cycles of therapy from 05/10/2013 - 08/23/2013. She began maintenance Herceptin on 09/13/2013 and completed a year of therapy in 04/2014.  Left modified radical mastectomy and axillary lymph node dissection on 10/05/2013 revealed residual focal grade III invasive carcinoma, with microscopic foci, with lymphovascular invasion.  Twelve lymph nodes were negative for malignancy.  Pathologic stage was ypT1a(m) ypN0.  She began radiation therapy in 11/2013. She was complicated by cellulitis of the left chest wall with staph aureus. She began Femara 2.5 mg a day in 02/2014.    CA27.29 has been followed:  22.9 on 10/04/2013, 15.7 on 08/01/2014, 23.5 on 09/21/2014, 15.2 on 10/31/2014, 16.7 on 03/04/2016, 20.8 on 08/08/2016, 19.6 on 02/10/2017, 23.8 on 08/11/2017, 29.8 on 02/12/2018, and 18.0 on 08/13/2018.  Right sided mammogram on 08/21/2016 revealed probably benign right breast calcifications.  Right diagnostic mammogram on 08/29/2017 revealed a group of indeterminate microcalcifications measuring 4 x 4 x 3 mm in the lateral slight upper right breast.  Biopsy on 10/08/2017 revealed luminal and stromal calcifications associated with microcysts and involuted lobules.  There was incidental artery with calcification.  There was no atypia or malignancy.  Bone density on 10/30/2015 was normal with a T-score of -0.8 in  the right femoral neck.  Bone density on 10/30/2017 revealed osteopenia with a T-score of -1.2 in the right femoral neck.  Symptomatically, she is doing well.  She voices no concerns.  Exam is stable.  Plan: 1.   Labs today:  CBC with diff, CMP, CA27.29. 2.   Stage IIB left breast cancer             Clinically, she is doing well.             Exam reveals no evidence of recurrent disease.             Continue Femara.             Consider BCI testing to assess benefit of extended adjuvant endocrine therapy.   Patient is interested in pursuing.  Mammogram on 08/31/2018. 3.   Osteopenia             Continue calcium, vitamin D and exercise.             Discuss plan for Prolia if extended adjuvant endocrine therapy pursued. 4.   RTC after mammogram for MD assessment, labs (CBC with diff, CMP, CA27.29), and review of mammogram.  I discussed the assessment and treatment plan with the patient.  The patient was provided an opportunity to ask questions and all were answered.  The patient agreed with the plan and demonstrated an understanding of the instructions.  The patient was advised to call back if the symptoms worsen or if the condition fails to improve as anticipated.   Lequita Asal, MD, PhD    08/13/2018, 2:31 PM  I, Selena Batten, am acting as scribe for [employer]. Mike Gip, MD, PhD.  I, Tykera Skates C. Mike Gip, MD, have reviewed the above documentation for accuracy and completeness, and I agree with the above.

## 2018-08-12 ENCOUNTER — Other Ambulatory Visit: Payer: Self-pay

## 2018-08-13 ENCOUNTER — Inpatient Hospital Stay: Payer: Medicare Other

## 2018-08-13 ENCOUNTER — Encounter: Payer: Self-pay | Admitting: Hematology and Oncology

## 2018-08-13 ENCOUNTER — Inpatient Hospital Stay: Payer: Medicare Other | Attending: Hematology and Oncology | Admitting: Hematology and Oncology

## 2018-08-13 VITALS — BP 128/82 | HR 74 | Temp 98.1°F | Resp 18 | Wt 227.4 lb

## 2018-08-13 DIAGNOSIS — Z170 Estrogen receptor positive status [ER+]: Secondary | ICD-10-CM | POA: Diagnosis not present

## 2018-08-13 DIAGNOSIS — C50912 Malignant neoplasm of unspecified site of left female breast: Secondary | ICD-10-CM | POA: Diagnosis not present

## 2018-08-13 DIAGNOSIS — M85851 Other specified disorders of bone density and structure, right thigh: Secondary | ICD-10-CM

## 2018-08-13 LAB — CBC WITH DIFFERENTIAL/PLATELET
Abs Immature Granulocytes: 0.03 10*3/uL (ref 0.00–0.07)
Basophils Absolute: 0.1 10*3/uL (ref 0.0–0.1)
Basophils Relative: 1 %
Eosinophils Absolute: 0.1 10*3/uL (ref 0.0–0.5)
Eosinophils Relative: 1 %
HCT: 35.5 % — ABNORMAL LOW (ref 36.0–46.0)
Hemoglobin: 11.6 g/dL — ABNORMAL LOW (ref 12.0–15.0)
Immature Granulocytes: 0 %
Lymphocytes Relative: 17 %
Lymphs Abs: 1.3 10*3/uL (ref 0.7–4.0)
MCH: 28.2 pg (ref 26.0–34.0)
MCHC: 32.7 g/dL (ref 30.0–36.0)
MCV: 86.4 fL (ref 80.0–100.0)
Monocytes Absolute: 0.4 10*3/uL (ref 0.1–1.0)
Monocytes Relative: 5 %
Neutro Abs: 5.6 10*3/uL (ref 1.7–7.7)
Neutrophils Relative %: 76 %
Platelets: 319 10*3/uL (ref 150–400)
RBC: 4.11 MIL/uL (ref 3.87–5.11)
RDW: 14.3 % (ref 11.5–15.5)
WBC: 7.4 10*3/uL (ref 4.0–10.5)
nRBC: 0 % (ref 0.0–0.2)

## 2018-08-13 LAB — COMPREHENSIVE METABOLIC PANEL
ALT: 21 U/L (ref 0–44)
AST: 21 U/L (ref 15–41)
Albumin: 3.7 g/dL (ref 3.5–5.0)
Alkaline Phosphatase: 97 U/L (ref 38–126)
Anion gap: 7 (ref 5–15)
BUN: 14 mg/dL (ref 8–23)
CO2: 23 mmol/L (ref 22–32)
Calcium: 8.8 mg/dL — ABNORMAL LOW (ref 8.9–10.3)
Chloride: 104 mmol/L (ref 98–111)
Creatinine, Ser: 0.88 mg/dL (ref 0.44–1.00)
GFR calc Af Amer: >60 mL/min (ref >60)
GFR calc non Af Amer: >60 mL/min (ref >60)
Glucose, Bld: 202 mg/dL — ABNORMAL HIGH (ref 70–99)
Potassium: 3.7 mmol/L (ref 3.5–5.1)
Sodium: 134 mmol/L — ABNORMAL LOW (ref 135–145)
Total Bilirubin: 0.2 mg/dL — ABNORMAL LOW (ref 0.3–1.2)
Total Protein: 7.4 g/dL (ref 6.5–8.1)

## 2018-08-13 NOTE — Progress Notes (Signed)
Pt here for follow up. Patient denies any concerns.  

## 2018-08-14 LAB — CA 27.29 (SERIAL MONITOR): CA 27.29: 18 U/mL (ref 0.0–38.6)

## 2018-09-01 ENCOUNTER — Ambulatory Visit
Admission: RE | Admit: 2018-09-01 | Discharge: 2018-09-01 | Disposition: A | Discharge status: Home / Self Care | Payer: Medicare Other | Source: Ambulatory Visit | Attending: Hematology and Oncology | Admitting: Hematology and Oncology

## 2018-09-01 ENCOUNTER — Other Ambulatory Visit: Payer: Self-pay

## 2018-09-01 DIAGNOSIS — Z9012 Acquired absence of left breast and nipple: Secondary | ICD-10-CM | POA: Insufficient documentation

## 2018-09-01 DIAGNOSIS — Z853 Personal history of malignant neoplasm of breast: Secondary | ICD-10-CM | POA: Diagnosis not present

## 2018-09-01 DIAGNOSIS — C50912 Malignant neoplasm of unspecified site of left female breast: Secondary | ICD-10-CM

## 2018-09-04 ENCOUNTER — Encounter: Payer: Self-pay | Admitting: Hematology and Oncology

## 2018-11-11 ENCOUNTER — Other Ambulatory Visit: Payer: Self-pay

## 2018-11-12 ENCOUNTER — Ambulatory Visit
Admission: RE | Admit: 2018-11-12 | Discharge: 2018-11-12 | Disposition: A | Discharge status: Home / Self Care | Payer: Medicare Other | Source: Ambulatory Visit | Attending: Radiation Oncology | Admitting: Radiation Oncology

## 2018-11-12 ENCOUNTER — Other Ambulatory Visit: Payer: Self-pay

## 2018-11-12 VITALS — BP 128/78 | HR 68 | Temp 96.9°F | Resp 18 | Wt 227.7 lb

## 2018-11-12 DIAGNOSIS — Z170 Estrogen receptor positive status [ER+]: Secondary | ICD-10-CM | POA: Diagnosis not present

## 2018-11-12 DIAGNOSIS — C50912 Malignant neoplasm of unspecified site of left female breast: Secondary | ICD-10-CM | POA: Diagnosis present

## 2018-11-12 DIAGNOSIS — Z79811 Long term (current) use of aromatase inhibitors: Secondary | ICD-10-CM | POA: Diagnosis not present

## 2018-11-12 DIAGNOSIS — Z923 Personal history of irradiation: Secondary | ICD-10-CM | POA: Insufficient documentation

## 2018-11-12 DIAGNOSIS — Z9012 Acquired absence of left breast and nipple: Secondary | ICD-10-CM | POA: Diagnosis not present

## 2018-11-12 NOTE — Progress Notes (Signed)
Radiation Oncology Follow up Note  Name: Mary Brady   Date:   11/12/2018 MRN:  [id number] DOB: Apr 11, 1950    This 68 y.o. female presents to the clinic today for 4-1/2-year follow-up status post left chest wall peripheral flag radiation for a T2N1 ER/PR positive HER-2 overexpressed invasive mammary carcinoma status post left modified radical mastectomy  REFERRING PROVIDER: Maryland Pink, MD  HPI: Patient is a 68 year old female status post left modified radical mastectomy for triple positive invasive mammary carcinoma.  She received chest wall performed static radiation.  She is seen today for 1/2 years out she is doing well.  She specifically denies any chest wall tenderness mass or nodularity.  She has had a mammogram of her right breast which I have reviewed back in August which was BI-RADS 1 negative..  She is currently on Femara tolerating that well without side effect.  COMPLICATIONS OF TREATMENT: none  FOLLOW UP COMPLIANCE: keeps appointments   PHYSICAL EXAM:  BP 128/78 (BP Location: Left Arm, Patient Position: Sitting)   Pulse 68   Temp (!) 96.9 F (36.1 C) (Tympanic)   Resp 18   Wt 227 lb 11.2 oz (103.3 kg)   BMI 36.75 kg/m  Patient status post left modified radical mastectomy chest wall is clear.  Right breast is free of dominant mass or nodularity.  No axillary or supraclavicular adenopathy is appreciated.  No evidence of lymphedema in her left upper extremity.  Well-developed well-nourished patient in NAD. HEENT reveals PERLA, EOMI, discs not visualized.  Oral cavity is clear. No oral mucosal lesions are identified. Neck is clear without evidence of cervical or supraclavicular adenopathy. Lungs are clear to A&P. Cardiac examination is essentially unremarkable with regular rate and rhythm without murmur rub or thrill. Abdomen is benign with no organomegaly or masses noted. Motor sensory and DTR levels are equal and symmetric in the upper and lower extremities. Cranial  nerves II through XII are grossly intact. Proprioception is intact. No peripheral adenopathy or edema is identified. No motor or sensory levels are noted. Crude visual fields are within normal range.  RADIOLOGY RESULTS: Right mammogram reviewed compatible with above-stated findings  PLAN: Patient is now close to 5 years out with no evidence of disease.  I am going to discontinue follow-up care.  Patient is being tested to see if extension of her antiestrogen therapy will be beneficial.  She knows to call at anytime with any concerns.  I would like to take this opportunity to thank you for allowing me to participate in the care of your patient.Noreene Filbert, MD

## 2019-01-29 ENCOUNTER — Other Ambulatory Visit: Payer: Self-pay | Admitting: Hematology and Oncology

## 2019-01-29 NOTE — Telephone Encounter (Signed)
Er last office note, patient was to have BCI testing to determine extended therapy. I do not see results for this in her chart. Her next appointment with you is 1/29. Please advise if you want to refill this since she has completed 5 years of therapy

## 2019-02-10 ENCOUNTER — Other Ambulatory Visit: Payer: Self-pay

## 2019-02-10 ENCOUNTER — Inpatient Hospital Stay: Payer: Medicare PPO | Attending: Hematology and Oncology

## 2019-02-10 DIAGNOSIS — C50912 Malignant neoplasm of unspecified site of left female breast: Secondary | ICD-10-CM

## 2019-02-10 DIAGNOSIS — C50112 Malignant neoplasm of central portion of left female breast: Secondary | ICD-10-CM | POA: Diagnosis not present

## 2019-02-10 DIAGNOSIS — Z170 Estrogen receptor positive status [ER+]: Secondary | ICD-10-CM | POA: Diagnosis not present

## 2019-02-10 LAB — COMPREHENSIVE METABOLIC PANEL
ALT: 17 U/L (ref 0–44)
AST: 19 U/L (ref 15–41)
Albumin: 3.9 g/dL (ref 3.5–5.0)
Alkaline Phosphatase: 104 U/L (ref 38–126)
Anion gap: 9 (ref 5–15)
BUN: 20 mg/dL (ref 8–23)
CO2: 25 mmol/L (ref 22–32)
Calcium: 9.1 mg/dL (ref 8.9–10.3)
Chloride: 101 mmol/L (ref 98–111)
Creatinine, Ser: 0.92 mg/dL (ref 0.44–1.00)
GFR calc Af Amer: >60 mL/min (ref >60)
GFR calc non Af Amer: >60 mL/min (ref >60)
Glucose, Bld: 136 mg/dL — ABNORMAL HIGH (ref 70–99)
Potassium: 4 mmol/L (ref 3.5–5.1)
Sodium: 135 mmol/L (ref 135–145)
Total Bilirubin: 0.6 mg/dL (ref 0.3–1.2)
Total Protein: 7.7 g/dL (ref 6.5–8.1)

## 2019-02-10 LAB — CBC WITH DIFFERENTIAL/PLATELET
Abs Immature Granulocytes: 0.03 10*3/uL (ref 0.00–0.07)
Basophils Absolute: 0.1 10*3/uL (ref 0.0–0.1)
Basophils Relative: 1 %
Eosinophils Absolute: 0.1 10*3/uL (ref 0.0–0.5)
Eosinophils Relative: 1 %
HCT: 38 % (ref 36.0–46.0)
Hemoglobin: 12.1 g/dL (ref 12.0–15.0)
Immature Granulocytes: 0 %
Lymphocytes Relative: 17 %
Lymphs Abs: 1.4 10*3/uL (ref 0.7–4.0)
MCH: 27.9 pg (ref 26.0–34.0)
MCHC: 31.8 g/dL (ref 30.0–36.0)
MCV: 87.6 fL (ref 80.0–100.0)
Monocytes Absolute: 0.5 10*3/uL (ref 0.1–1.0)
Monocytes Relative: 6 %
Neutro Abs: 6 10*3/uL (ref 1.7–7.7)
Neutrophils Relative %: 75 %
Platelets: 328 10*3/uL (ref 150–400)
RBC: 4.34 MIL/uL (ref 3.87–5.11)
RDW: 14.4 % (ref 11.5–15.5)
WBC: 8.1 10*3/uL (ref 4.0–10.5)
nRBC: 0 % (ref 0.0–0.2)

## 2019-02-10 NOTE — Progress Notes (Signed)
Confirmed Name and DOB. Denies any concerns.  

## 2019-02-11 ENCOUNTER — Other Ambulatory Visit: Payer: Medicare Other

## 2019-02-11 ENCOUNTER — Inpatient Hospital Stay: Payer: Medicare PPO | Admitting: Hematology and Oncology

## 2019-02-11 DIAGNOSIS — C50912 Malignant neoplasm of unspecified site of left female breast: Secondary | ICD-10-CM

## 2019-02-11 DIAGNOSIS — M85851 Other specified disorders of bone density and structure, right thigh: Secondary | ICD-10-CM

## 2019-02-11 LAB — CANCER ANTIGEN 27.29: CA 27.29: 30.1 U/mL (ref 0.0–38.6)

## 2019-02-26 ENCOUNTER — Encounter: Payer: Self-pay | Admitting: Hematology and Oncology

## 2019-02-26 NOTE — Progress Notes (Signed)
This encounter was created in error - please disregard.

## 2019-02-26 NOTE — Progress Notes (Signed)
No new changes noted today. The patient name and DOB has been verified by phone today. 

## 2019-02-27 NOTE — Progress Notes (Signed)
[facility]  [address], [place] [address] Phone: [phone]  Fax: [phone]   Telemedicine Office Visit:  03/01/2019  Referring physician: Maryland Pink, MD  I connected with [name] on 03/01/2019 at 3:15 PM by videoconferencing and verified that I was speaking with the correct person using 2 identifiers.  The patient was in her car at home for better privacy.  I discussed the limitations, risk, security and privacy concerns of performing an evaluation and management service by videoconferencing and the availability of in person appointments.  I also discussed with the patient that there may be a patient responsible charge related to this service.  The patient expressed understanding and agreed to proceed.   Chief Complaint: Mary Brady is a 69 y.o. female with stage IIB left breast cancer who is seen for 7 month assessment, review of interval mammogram and BCI testing, and discussion regarding direction of therapy.    HPI: The patient was last seen in the medical oncology clinic on 08/13/2018. At that time, she was doing well. She voiced no concerns. Exam was stable. Hematocrit 35.5, hemoglobin 11.6, platelets 319,000, WBC 7,400. Sodium 134, calcium 8.8, total bilirubin 0.2. CA 27.29 was 18.0. We dicussed plan for prolia if extended adjuvant endocrine therapy pursued. We planned for a mammogram in 08/2018. She continued on Femara.   BCI testing on 08/27/2018 revealed a 14.5% risk of late recurrence (95% CI:  8-20.4%) and a benefit of extended endocrine therapy.  Right unilateral diagnostic mammogram on 09/01/2018 revealed no evidence of RIGHT breast malignancy.   Patient was seen by Dr. Baruch Gouty on 11/12/2018. She was doing well. She denied any chest wall tenderness mass or nodularity. Her follow up care was discontinued.   Labs on 02/10/2019: Hematocrit 38.0, hemoglobin 12.1, platelets 328,000, WBC 8,100. CMP was normal. CA  27.29 was 30.1.   During the interim, she has felt good. She reports bilateral knee pain. She has occasional breast pain and discomfort. Her thyroid is "out of wack". She denies any abdominal or urinary problems.   Patient is not interested in plastic surgery. She would like a bra that will help her breast appear even. She notes feeling concerned for her friend's health who is going through breast cancer as well.   She plans to have an evaluation for dental clearance to begin Prolia.  She plans to continue endocrine therapy.    Past Medical History:  Diagnosis Date  . Arthritis   . Breast cancer ([place]) 2015   LT MASTECTOMY  . Cataract   . Depression   . Diabetes mellitus without complication ([place])   . Dysfunctional uterine bleeding   . History of colonic polyps   . Hypertension   . Hypothyroidism 07/09/2007  . Personal history of chemotherapy 2015   BREAST CA  . Personal history of radiation therapy 2015   BREAST CA    Past Surgical History:  Procedure Laterality Date  . BREAST BIOPSY Left 03/2013   positive  . BREAST BIOPSY Right 10/08/2017   rt stereo x clip- LUMINAL AND STROMAL CALCIFICATIONS ASSOCIATED WITH MICROCYSTS AND   . CATARACT EXTRACTION Bilateral 02/08/2013  . CATARACT EXTRACTION W/PHACO Left 01/20/2018   Procedure: CATARACT EXTRACTION PHACO AND INTRAOCULAR LENS PLACEMENT ([place])  LEFT DIABETIC;  Surgeon: Leandrew Koyanagi, MD;  Location: [facility];  Service: Ophthalmology;  Laterality: Left;  Diabetic  . COLONOSCOPY    . COLONOSCOPY WITH PROPOFOL N/A 12/01/2014   Procedure: COLONOSCOPY WITH PROPOFOL;  Surgeon: Manya Silvas, MD;  Location: [facility] ENDOSCOPY;  Service: Endoscopy;  Laterality: N/A;  . ENDOMETRIAL BIOPSY    . MASTECTOMY MODIFIED RADICAL Left 10/05/2013   BREAST CA  . SKIN BIOPSY      Family History  Problem Relation Age of Onset  . Cancer Other   . Cancer Cousin   . Breast cancer Cousin     Social History:  reports that she has  never smoked. She has never used smokeless tobacco. She reports that she does not drink alcohol or use drugs. She is a retired Sales executive. She lives in [place]. The patient is alone today.  Participants in the patient's visit and their role in the encounter included the patient and Vito Berger, CMA, today.  The intake visit was provided by Vito Berger, CMA.   Allergies:  Allergies  Allergen Reactions  . Demerol [Meperidine] Other (See Comments)    "Hyper"  . Penicillins Rash    Current Medications: Current Outpatient Medications  Medication Sig Dispense Refill  . ACCU-CHEK AVIVA PLUS test strip USE 2 (TWO) TIMES DAILY    . bisoprolol-hydrochlorothiazide (ZIAC) 5-6.25 MG per tablet Take 1 tablet by mouth at bedtime.    . Calcium Carb-Cholecalciferol 500-400 MG-UNIT TABS Take 1 tablet by mouth 2 (two) times daily.    . cetirizine (ZYRTEC) 10 MG tablet Take 10 mg by mouth daily.     Marland Kitchen FLUoxetine (PROZAC) 20 MG tablet Take 20 mg by mouth daily.    Marland Kitchen glimepiride (AMARYL) 4 MG tablet Take 4 mg by mouth 2 (two) times daily.    Marland Kitchen KLOR-CON M20 20 MEQ tablet TAKE 1 TABLET BY MOUTH ONCE A DAY 30 tablet 6  . Lancets MISC Use 1 each 2 (two) times daily    . letrozole (FEMARA) 2.5 MG tablet TAKE 1 TABLET BY MOUTH EVERY DAY 90 tablet 1  . levothyroxine (SYNTHROID) 200 MCG tablet Take 200 mcg by mouth daily before breakfast.     . magnesium chloride (SLOW-MAG) 64 MG TBEC SR tablet Take 1 tablet by mouth 2 (two) times daily.    . meloxicam (MOBIC) 15 MG tablet Take 15 mg by mouth as needed.     . pioglitazone (ACTOS) 30 MG tablet Take 30 mg by mouth daily.     . simvastatin (ZOCOR) 20 MG tablet Take 20 mg by mouth at bedtime.    . TRULICITY 1.5 CH/8.5ID SOPN INJECT 1.5 MG SUBCUTANEOUSLY EVERY 7 (SEVEN) DAYS    . acetaminophen (TYLENOL) 500 MG tablet Take 1,000 mg by mouth every 6 (six) hours as needed (for pain).     No current facility-administered medications for this  visit.    Review of Systems  Constitutional: Negative for chills, diaphoresis, fever, malaise/fatigue and weight loss.       Feels "good".  HENT: Negative.  Negative for congestion, ear pain, nosebleeds, sinus pain and sore throat.   Eyes: Negative.  Negative for double vision, photophobia and pain.       S/p left cataract surgery (vision improved).  Respiratory: Negative.  Negative for cough, hemoptysis, sputum production and shortness of breath.   Cardiovascular: Positive for chest pain (occasional breast tenderness and discomfort). Negative for palpitations, orthopnea, leg swelling and PND.  Gastrointestinal: Negative.  Negative for abdominal pain, blood in stool, constipation, diarrhea, melena, nausea and vomiting.  Genitourinary: Negative.  Negative for dysuria, frequency, hematuria and urgency.  Musculoskeletal: Positive for joint pain (bilateral knee). Negative for back pain, falls, myalgias  and neck pain.  Skin: Negative.  Negative for itching and rash.  Neurological: Negative.  Negative for dizziness, tremors, speech change, focal weakness, weakness and headaches.  Endo/Heme/Allergies: Does not bruise/bleed easily.       Diabetes.  Thyroid disease "out of wack".  Psychiatric/Behavioral: Negative.  Negative for depression and memory loss. The patient is not nervous/anxious and does not have insomnia.   All other systems reviewed and are negative.  Performance status (ECOG): 0   Physical Exam  Constitutional: She is oriented to person, place, and time. She appears well-developed and well-nourished. No distress.  HENT:  Head: Normocephalic and atraumatic.  Short gray hair.  Eyes: Conjunctivae and EOM are normal. No scleral icterus.  Brown eyes.  Neurological: She is alert and oriented to person, place, and time.  Skin: She is not diaphoretic.  Psychiatric: She has a normal mood and affect. Her behavior is normal. Judgment and thought content normal.  Nursing note  reviewed.   No visits with results within 3 Day(s) from this visit.  Latest known visit with results is:  Appointment on 02/10/2019  Component Date Value Ref Range Status  . CA 27.29 02/10/2019 30.1  0.0 - 38.6 U/mL Final   Comment: (NOTE) Siemens Centaur Immunochemiluminometric Methodology [facility]) Values obtained with different assay methods or kits cannot be used interchangeably. Results cannot be interpreted as absolute evidence of the presence or absence of malignant disease. Performed At: [facility] [place], [place] [id number] Rush Farmer MD YB:[id number]   . Sodium 02/10/2019 135  135 - 145 mmol/L Final  . Potassium 02/10/2019 4.0  3.5 - 5.1 mmol/L Final  . Chloride 02/10/2019 101  98 - 111 mmol/L Final  . CO2 02/10/2019 25  22 - 32 mmol/L Final  . Glucose, Bld 02/10/2019 136* 70 - 99 mg/dL Final  . BUN 02/10/2019 20  8 - 23 mg/dL Final  . Creatinine, Ser 02/10/2019 0.92  0.44 - 1.00 mg/dL Final  . Calcium 02/10/2019 9.1  8.9 - 10.3 mg/dL Final  . Total Protein 02/10/2019 7.7  6.5 - 8.1 g/dL Final  . Albumin 02/10/2019 3.9  3.5 - 5.0 g/dL Final  . AST 02/10/2019 19  15 - 41 U/L Final  . ALT 02/10/2019 17  0 - 44 U/L Final  . Alkaline Phosphatase 02/10/2019 104  38 - 126 U/L Final  . Total Bilirubin 02/10/2019 0.6  0.3 - 1.2 mg/dL Final  . GFR calc non Af Amer 02/10/2019 >60  >60 mL/min Final  . GFR calc Af Amer 02/10/2019 >60  >60 mL/min Final  . Anion gap 02/10/2019 9  5 - 15 Final   Performed at [facility], [address] [place], [place] [address]  . WBC 02/10/2019 8.1  4.0 - 10.5 K/uL Final  . RBC 02/10/2019 4.34  3.87 - 5.11 MIL/uL Final  . Hemoglobin 02/10/2019 12.1  12.0 - 15.0 g/dL Final  . HCT 02/10/2019 38.0  36.0 - 46.0 % Final  . MCV 02/10/2019 87.6  80.0 - 100.0 fL Final  . MCH 02/10/2019 27.9  26.0 - 34.0 pg Final  . MCHC 02/10/2019 31.8  30.0 - 36.0 g/dL Final  . RDW 02/10/2019 14.4  11.5 - 15.5 % Final  .  Platelets 02/10/2019 328  150 - 400 K/uL Final  . nRBC 02/10/2019 0.0  0.0 - 0.2 % Final  . Neutrophils Relative % 02/10/2019 75  % Final  . Neutro Abs 02/10/2019 6.0  1.7 - 7.7  K/uL Final  . Lymphocytes Relative 02/10/2019 17  % Final  . Lymphs Abs 02/10/2019 1.4  0.7 - 4.0 K/uL Final  . Monocytes Relative 02/10/2019 6  % Final  . Monocytes Absolute 02/10/2019 0.5  0.1 - 1.0 K/uL Final  . Eosinophils Relative 02/10/2019 1  % Final  . Eosinophils Absolute 02/10/2019 0.1  0.0 - 0.5 K/uL Final  . Basophils Relative 02/10/2019 1  % Final  . Basophils Absolute 02/10/2019 0.1  0.0 - 0.1 K/uL Final  . Immature Granulocytes 02/10/2019 0  % Final  . Abs Immature Granulocytes 02/10/2019 0.03  0.00 - 0.07 K/uL Final   Performed at [facility], [address] [place], [place] [address]    Assessment:  MEOSHIA BILLING is a 69 y.o. female with clinical stage IIB Her2/neu + left breast cancers/p mastectomy and axillary lymph node dissection after neoadjuvant chemotherapy. Ultrasound guided breast biopsy on 04/14/2015 revealed grade III invasive mammary carcinoma with no special type. Lymphovascular invasion was present. Left axillary lymph node biopsy revealed metastatic carcinoma. Tumor was ER positive (1-5%), PR positive (1%) and HER-2/neu positive by FISH. Clinical stagewas T2N1M0.  Mammogram and ultrasoundon 04/07/2013 revealed a 4 cm irregular mass located within the subareolar portion of the left breast with nipple retraction and thickening of the skin of the nipple. There were at least 3 abnormal appearing level I left axillary lymph nodes. PET scanon 05/07/2013 revealed no evidence of metastatic disease.   She enrolled onNSABP B-52and randomized to carboplatin, Taxotere, Herceptin and Perjeta. She received 6 cycles of therapy from 05/10/2013 - 08/23/2013. She began maintenance Herceptinon 09/13/2013 and completed a year of therapy in 04/2014.  Left modified radical  mastectomy and axillary lymph node dissection on 10/05/2013 revealed residual focal grade III invasive carcinoma, with microscopic foci, with lymphovascular invasion. Twelve lymph nodes were negative for malignancy. Pathologic stagewas ypT1a(m) ypN0.  She began radiationtherapy in 11/2013. She was complicated by cellulitis of the left chest wall with staph aureus. She began Femara2.5 mg a day in 02/2014.   BCI testing on 08/27/2018 revealed a 14.5% risk of late recurrence (95% CI:  8-20.4%) and a benefit of extended endocrine therapy.  CA27.29 has been followed: 22.9 on 10/04/2013, 15.7 on 08/01/2014, 23.5 on 09/21/2014, 15.2 on 10/31/2014, 16.7 on 03/04/2016, 20.8 on 08/08/2016, 19.6 on 02/10/2017, 23.8 on 08/11/2017, 29.8 on 02/12/2018, and 18.0 on 08/13/2018.  Right sided mammogramon 08/21/2016 revealed probably benign right breast calcifications. Right diagnostic mammogramon 08/29/2017 revealed a group of indeterminate microcalcifications measuring 4 x 4 x 3 mm in the lateral slight upper right breast.Biopsyon 10/08/2017 revealed luminal and stromal calcifications associated with microcysts and involuted lobules. There was incidental artery with calcification. There was no atypia or malignancy.  Right diagnostic mammogram on 09/01/2018 revealed no evidence of RIGHT breast malignancy.   Bone density on 10/30/2015 was normalwith a T-score of -0.8 in the right femoral neck. Bone densityon 10/30/2017 revealed osteopenia with a T-score of -1.2 in the right femoral neck.  Symptomatically, she is doing well.  She voices no concerns.  Plan: 1.   Labs today: CBC with diff, CMP, CA 27.29. 2.Stage IIB left breast cancer Clinically she is doing well. Exam on 08/13/2018 revealed no evidence of recurrent disease. Right diagnostic mammogram on 09/01/2018 revealed no evidence of malignancy.   Discuss BCI testing-benefit of extended adjuvant  therapy.   Copy of BCI report mailed to patient Continue Femara  Rx:  breast prosthesis. 3.Osteopenia Patient to continue calcium and  vitamin D. Discuss consideration of Prolia.   Potential side effects reviewed.  Information provided.     Preauth Prolia.   Discuss dental clearance.  RN to follow-up on [place] and dental clearance. 4.   RTC in 6 months for MD assessment and labs (CBC with diff, CMP, CA 27-29).  I discussed the assessment and treatment plan with the patient.  The patient was provided an opportunity to ask questions and all were answered.  The patient agreed with the plan and demonstrated an understanding of the instructions.  The patient was advised to call back if the symptoms worsen or if the condition fails to improve as anticipated.   Lequita Asal, MD, PhD    03/01/2019, 3:15 PM  I, Selena Batten, am acting as scribe for [employer]. Mike Gip, MD, PhD.  I, Palyn Scrima C. Mike Gip, MD, have reviewed the above documentation for accuracy and completeness, and I agree with the above.

## 2019-03-01 ENCOUNTER — Encounter: Payer: Self-pay | Admitting: Hematology and Oncology

## 2019-03-01 ENCOUNTER — Inpatient Hospital Stay: Payer: Medicare PPO | Attending: Hematology and Oncology | Admitting: Hematology and Oncology

## 2019-03-01 DIAGNOSIS — Z170 Estrogen receptor positive status [ER+]: Secondary | ICD-10-CM | POA: Diagnosis not present

## 2019-03-01 DIAGNOSIS — M85851 Other specified disorders of bone density and structure, right thigh: Secondary | ICD-10-CM | POA: Diagnosis not present

## 2019-03-01 DIAGNOSIS — C50912 Malignant neoplasm of unspecified site of left female breast: Secondary | ICD-10-CM

## 2019-03-01 NOTE — Patient Instructions (Signed)
Denosumab injection What is this medicine? DENOSUMAB (den oh sue mab) slows bone breakdown. Prolia is used to treat osteoporosis in women after menopause and in men, and in people who are taking corticosteroids for 6 months or more. Xgeva is used to treat a high calcium level due to cancer and to prevent bone fractures and other bone problems caused by multiple myeloma or cancer bone metastases. Xgeva is also used to treat giant cell tumor of the bone. This medicine may be used for other purposes; ask your health care provider or pharmacist if you have questions. COMMON BRAND NAME(S): Prolia, XGEVA What should I tell my health care provider before I take this medicine? They need to know if you have any of these conditions:  dental disease  having surgery or tooth extraction  infection  kidney disease  low levels of calcium or Vitamin D in the blood  malnutrition  on hemodialysis  skin conditions or sensitivity  thyroid or parathyroid disease  an unusual reaction to denosumab, other medicines, foods, dyes, or preservatives  pregnant or trying to get pregnant  breast-feeding How should I use this medicine? This medicine is for injection under the skin. It is given by a health care professional in a hospital or clinic setting. A special MedGuide will be given to you before each treatment. Be sure to read this information carefully each time. For Prolia, talk to your pediatrician regarding the use of this medicine in children. Special care may be needed. For Xgeva, talk to your pediatrician regarding the use of this medicine in children. While this drug may be prescribed for children as young as 13 years for selected conditions, precautions do apply. Overdosage: If you think you have taken too much of this medicine contact a poison control center or emergency room at once. NOTE: This medicine is only for you. Do not share this medicine with others. What if I miss a dose? It is  important not to miss your dose. Call your doctor or health care professional if you are unable to keep an appointment. What may interact with this medicine? Do not take this medicine with any of the following medications:  other medicines containing denosumab This medicine may also interact with the following medications:  medicines that lower your chance of fighting infection  steroid medicines like prednisone or cortisone This list may not describe all possible interactions. Give your health care provider a list of all the medicines, herbs, non-prescription drugs, or dietary supplements you use. Also tell them if you smoke, drink alcohol, or use illegal drugs. Some items may interact with your medicine. What should I watch for while using this medicine? Visit your doctor or health care professional for regular checks on your progress. Your doctor or health care professional may order blood tests and other tests to see how you are doing. Call your doctor or health care professional for advice if you get a fever, chills or sore throat, or other symptoms of a cold or flu. Do not treat yourself. This drug may decrease your body's ability to fight infection. Try to avoid being around people who are sick. You should make sure you get enough calcium and vitamin D while you are taking this medicine, unless your doctor tells you not to. Discuss the foods you eat and the vitamins you take with your health care professional. See your dentist regularly. Brush and floss your teeth as directed. Before you have any dental work done, tell your dentist you are   receiving this medicine. Do not become pregnant while taking this medicine or for 5 months after stopping it. Talk with your doctor or health care professional about your birth control options while taking this medicine. Women should inform their doctor if they wish to become pregnant or think they might be pregnant. There is a potential for serious side  effects to an unborn child. Talk to your health care professional or pharmacist for more information. What side effects may I notice from receiving this medicine? Side effects that you should report to your doctor or health care professional as soon as possible:  allergic reactions like skin rash, itching or hives, swelling of the face, lips, or tongue  bone pain  breathing problems  dizziness  jaw pain, especially after dental work  redness, blistering, peeling of the skin  signs and symptoms of infection like fever or chills; cough; sore throat; pain or trouble passing urine  signs of low calcium like fast heartbeat, muscle cramps or muscle pain; pain, tingling, numbness in the hands or feet; seizures  unusual bleeding or bruising  unusually weak or tired Side effects that usually do not require medical attention (report to your doctor or health care professional if they continue or are bothersome):  constipation  diarrhea  headache  joint pain  loss of appetite  muscle pain  runny nose  tiredness  upset stomach This list may not describe all possible side effects. Call your doctor for medical advice about side effects. You may report side effects to FDA at 1-800-FDA-1088. Where should I keep my medicine? This medicine is only given in a clinic, doctor's office, or other health care setting and will not be stored at home. NOTE: This sheet is a summary. It may not cover all possible information. If you have questions about this medicine, talk to your doctor, pharmacist, or health care provider.  2020 Elsevier/Gold Standard (2017-05-09 16:10:44)

## 2019-08-30 ENCOUNTER — Other Ambulatory Visit: Payer: Medicare PPO

## 2019-08-30 ENCOUNTER — Ambulatory Visit: Payer: Medicare PPO | Admitting: Hematology and Oncology

## 2019-10-03 NOTE — Progress Notes (Signed)
[facility]  [address], [address] [place], [place] [address] Phone: [phone]  Fax: [phone]  Office Visit:  10/04/2019  Referring physician: Maryland Pink, MD  Chief Complaint: Mary Brady is a 69 y.o. female with stage IIB left breast cancer who is seen for 6 month assessment.  HPI: The patient was last seen virtually in the medical oncology clinic on 03/01/2019. At that time, she was doing well.  She voiced no concerns.  Labs on 02/10/2019 revealed a normal CBC with differential, CMP and CA 27.29 (30.1).  She was scheduled for follow-up in 6 months.  She is overdue for her annual mammography; last mammogram was on 09/01/2018.   During the interim, she has been well. She had a tooth extraction a little over a month ago. Her dentist is Dr. Magda Paganini Hargis-Fuller at [facility]. She does have another tooth that likely needs to be extracted.   She had an appointment with her endocrinologist and her PCP on 10/11/2019.   She notes that her diet has not been the best. She doesn't eat a lot of meat or dark leafy vegetables.    Past Medical History:  Diagnosis Date  . Arthritis   . Breast cancer ([place]) 2015   LT MASTECTOMY  . Cataract   . Depression   . Diabetes mellitus without complication ([place])   . Dysfunctional uterine bleeding   . History of colonic polyps   . Hypertension   . Hypothyroidism 07/09/2007  . Personal history of chemotherapy 2015   BREAST CA  . Personal history of radiation therapy 2015   BREAST CA    Past Surgical History:  Procedure Laterality Date  . BREAST BIOPSY Left 03/2013   positive  . BREAST BIOPSY Right 10/08/2017   rt stereo x clip- LUMINAL AND STROMAL CALCIFICATIONS ASSOCIATED WITH MICROCYSTS AND   . CATARACT EXTRACTION Bilateral 02/08/2013  . CATARACT EXTRACTION W/PHACO Left 01/20/2018   Procedure: CATARACT EXTRACTION PHACO AND INTRAOCULAR LENS PLACEMENT ([place])  LEFT DIABETIC;  Surgeon:  Leandrew Koyanagi, MD;  Location: [facility];  Service: Ophthalmology;  Laterality: Left;  Diabetic  . COLONOSCOPY    . COLONOSCOPY WITH PROPOFOL N/A 12/01/2014   Procedure: COLONOSCOPY WITH PROPOFOL;  Surgeon: Manya Silvas, MD;  Location: [facility] ENDOSCOPY;  Service: Endoscopy;  Laterality: N/A;  . ENDOMETRIAL BIOPSY    . MASTECTOMY MODIFIED RADICAL Left 10/05/2013   BREAST CA  . SKIN BIOPSY      Family History  Problem Relation Age of Onset  . Cancer Other   . Cancer Cousin   . Breast cancer Cousin     Social History:  reports that she has never smoked. She has never used smokeless tobacco. She reports that she does not drink alcohol and does not use drugs. She is a retired Sales executive. She lives in [place]. The patient is alone today.   Allergies:  Allergies  Allergen Reactions  . Demerol [Meperidine] Other (See Comments)    "Hyper"  . Penicillins Rash    Current Medications: Current Outpatient Medications  Medication Sig Dispense Refill  . ACCU-CHEK AVIVA PLUS test strip USE 2 (TWO) TIMES DAILY    . acetaminophen (TYLENOL) 500 MG tablet Take 1,000 mg by mouth every 6 (six) hours as needed (for pain).    . bisoprolol-hydrochlorothiazide (ZIAC) 5-6.25 MG per tablet Take 1 tablet by mouth at bedtime.    . Calcium Carb-Cholecalciferol 500-400 MG-UNIT TABS Take 1 tablet by mouth 2 (  two) times daily.    . cetirizine (ZYRTEC) 10 MG tablet Take 10 mg by mouth daily.     Marland Kitchen FLUoxetine (PROZAC) 20 MG tablet Take 20 mg by mouth daily.    Marland Kitchen glimepiride (AMARYL) 4 MG tablet Take 4 mg by mouth 2 (two) times daily.    Marland Kitchen KLOR-CON M20 20 MEQ tablet TAKE 1 TABLET BY MOUTH ONCE A DAY 30 tablet 6  . Lancets MISC Use 1 each 2 (two) times daily    . letrozole (FEMARA) 2.5 MG tablet TAKE 1 TABLET BY MOUTH EVERY DAY 90 tablet 1  . levothyroxine (SYNTHROID) 200 MCG tablet Take 200 mcg by mouth daily before breakfast.     . magnesium chloride (SLOW-MAG) 64 MG TBEC SR  tablet Take 1 tablet by mouth 2 (two) times daily.    . meloxicam (MOBIC) 15 MG tablet Take 15 mg by mouth as needed.     . pioglitazone (ACTOS) 30 MG tablet Take 30 mg by mouth daily.     . simvastatin (ZOCOR) 20 MG tablet Take 20 mg by mouth at bedtime.    . TRULICITY 1.5 QT/6.2UQ SOPN INJECT 1.5 MG SUBCUTANEOUSLY EVERY 7 (SEVEN) DAYS     No current facility-administered medications for this visit.    Review of Systems  Constitutional: Negative for chills, diaphoresis, fever, malaise/fatigue and weight loss.       Feels "ok".  HENT: Negative.  Negative for congestion, ear pain, nosebleeds, sinus pain and sore throat.        Tooth extracted 5 weeks ago.  Eyes: Negative.  Negative for double vision, photophobia and pain.       S/p left cataract surgery (vision improved).  Respiratory: Negative.  Negative for cough, hemoptysis, sputum production and shortness of breath.   Cardiovascular: Positive for chest pain (occasional breast tenderness and discomfort). Negative for palpitations, orthopnea, leg swelling and PND.  Gastrointestinal: Negative.  Negative for abdominal pain, blood in stool, constipation, diarrhea, melena, nausea and vomiting.  Genitourinary: Negative.  Negative for dysuria, frequency, hematuria and urgency.  Musculoskeletal: Positive for joint pain (bilateral knee). Negative for back pain, falls, myalgias and neck pain.  Skin: Negative.  Negative for itching and rash.  Neurological: Negative.  Negative for dizziness, tremors, speech change, focal weakness, weakness and headaches.  Endo/Heme/Allergies: Does not bruise/bleed easily.       Diabetes.  Hemoglobin A1c is 9.  Feels better on thyroid medication.  Psychiatric/Behavioral: Negative.  Negative for depression and memory loss. The patient is not nervous/anxious and does not have insomnia.   All other systems reviewed and are negative.  Performance status (ECOG): 0   Blood pressure 126/80, pulse 71, temperature (!) 97.2  F (36.2 C), temperature source Tympanic, weight 230 lb 11.4 oz (104.6 kg), SpO2 98 %.   Physical Exam Nursing note reviewed.  Constitutional:      General: She is not in acute distress.    Appearance: Normal appearance. She is well-developed. She is not diaphoretic.     Interventions: Face mask in place.  HENT:     Head: Normocephalic and atraumatic.     Comments: Short gray hair.    Mouth/Throat:     Mouth: No oral lesions.  Eyes:     General: No scleral icterus.    Conjunctiva/sclera: Conjunctivae normal.     Pupils: Pupils are equal, round, and reactive to light.     Comments: Brown eyes.  Cardiovascular:     Rate and Rhythm: Normal rate and  regular rhythm.     Heart sounds: Normal heart sounds. No murmur heard.  No friction rub. No gallop.   Pulmonary:     Effort: Pulmonary effort is normal.     Breath sounds: Normal breath sounds. No wheezing, rhonchi or rales.  Abdominal:     General: Bowel sounds are normal.     Palpations: Abdomen is soft. There is no mass.     Tenderness: There is no abdominal tenderness.  Musculoskeletal:        General: No tenderness. Normal range of motion.     Cervical back: Normal range of motion and neck supple.  Lymphadenopathy:     Head:     Right side of head: No preauricular, posterior auricular or occipital adenopathy.     Left side of head: No preauricular, posterior auricular or occipital adenopathy.     Cervical: No cervical adenopathy.     Right cervical: No superficial cervical adenopathy.    Upper Body:     Right upper body: No supraclavicular or axillary adenopathy.     Left upper body: No supraclavicular or axillary adenopathy.     Lower Body: No right inguinal adenopathy. No left inguinal adenopathy.  Skin:    General: Skin is warm and dry.     Findings: No bruising, erythema, lesion or rash.  Neurological:     Mental Status: She is alert and oriented to person, place, and time.  Psychiatric:        Behavior: Behavior  normal.        Thought Content: Thought content normal.        Judgment: Judgment normal.     Orders Only on 10/04/2019  Component Date Value Ref Range Status  . Folate 10/04/2019 9.1  >5.9 ng/mL Final   Performed at [facility], [address] [place], [place] [address]  . Vitamin B-12 10/04/2019 252  180 - 914 pg/mL Final   Comment: (NOTE) This assay is not validated for testing neonatal or myeloproliferative syndrome specimens for Vitamin B12 levels. Performed at [facility], [place] [address] [place], [place] [address]   . Iron 10/04/2019 36  28 - 170 ug/dL Final  . TIBC 10/04/2019 389  250 - 450 ug/dL Final  . Saturation Ratios 10/04/2019 9* 10.4 - 31.8 % Final  . UIBC 10/04/2019 353  ug/dL Final   Performed at [facility], [address], [place], [place] [address]  . Ferritin 10/04/2019 53  11 - 307 ng/mL Final   Performed at [facility], [address] [place], [place] [address]  Appointment on 10/04/2019  Component Date Value Ref Range Status  . Sodium 10/04/2019 132* 135 - 145 mmol/L Final  . Potassium 10/04/2019 4.0  3.5 - 5.1 mmol/L Final  . Chloride 10/04/2019 101  98 - 111 mmol/L Final  . CO2 10/04/2019 24  22 - 32 mmol/L Final  . Glucose, Bld 10/04/2019 155* 70 - 99 mg/dL Final   Glucose reference range applies only to samples taken after fasting for at least 8 hours.  . BUN 10/04/2019 17  8 - 23 mg/dL Final  . Creatinine, Ser 10/04/2019 0.92  0.44 - 1.00 mg/dL Final  . Calcium 10/04/2019 8.5* 8.9 - 10.3 mg/dL Final  . Total Protein 10/04/2019 7.2  6.5 - 8.1 g/dL Final  . Albumin 10/04/2019 3.6  3.5 - 5.0 g/dL Final  . AST 10/04/2019 24  15 - 41 U/L Final  . ALT 10/04/2019 20  0 - 44 U/L Final  . Alkaline Phosphatase 10/04/2019 86  38 - 126 U/L Final  . Total Bilirubin 10/04/2019 0.4  0.3 - 1.2 mg/dL Final  . GFR calc non Af Amer 10/04/2019 >60  >60 mL/min Final  . GFR calc Af Amer 10/04/2019 >60  >60 mL/min Final  .  Anion gap 10/04/2019 7  5 - 15 Final   Performed at [facility], [address], [place], [place] [address]  . WBC 10/04/2019 6.6  4.0 - 10.5 K/uL Final  . RBC 10/04/2019 3.66* 3.87 - 5.11 MIL/uL Final  . Hemoglobin 10/04/2019 11.3* 12.0 - 15.0 g/dL Final  . HCT 10/04/2019 33.5* 36 - 46 % Final  . MCV 10/04/2019 91.5  80.0 - 100.0 fL Final  . MCH 10/04/2019 30.9  26.0 - 34.0 pg Final  . MCHC 10/04/2019 33.7  30.0 - 36.0 g/dL Final  . RDW 10/04/2019 17.0* 11.5 - 15.5 % Final  . Platelets 10/04/2019 370  150 - 400 K/uL Final  . nRBC 10/04/2019 0.0  0.0 - 0.2 % Final  . Neutrophils Relative % 10/04/2019 71  % Final  . Neutro Abs 10/04/2019 4.7  1.7 - 7.7 K/uL Final  . Lymphocytes Relative 10/04/2019 21  % Final  . Lymphs Abs 10/04/2019 1.4  0.7 - 4.0 K/uL Final  . Monocytes Relative 10/04/2019 6  % Final  . Monocytes Absolute 10/04/2019 0.4  0 - 1 K/uL Final  . Eosinophils Relative 10/04/2019 1  % Final  . Eosinophils Absolute 10/04/2019 0.1  0 - 0 K/uL Final  . Basophils Relative 10/04/2019 1  % Final  . Basophils Absolute 10/04/2019 0.0  0 - 0 K/uL Final  . Immature Granulocytes 10/04/2019 0  % Final  . Abs Immature Granulocytes 10/04/2019 0.02  0.00 - 0.07 K/uL Final   Performed at [facility], [address], [place], [place] [address]  . CA 27.29 10/04/2019 20.8  0.0 - 38.6 U/mL Final   Comment: (NOTE) Siemens Centaur Immunochemiluminometric Methodology [facility]) Values obtained with different assay methods or kits cannot be used interchangeably. Results cannot be interpreted as absolute evidence of the presence or absence of malignant disease. Performed At: [facility] [place], [place] [id number] Rush Farmer MD MG:[id number]     Assessment:  Mary Brady is a 69 y.o. female with clinical stage IIB Her2/neu + left breast cancers/p mastectomy and axillary lymph node dissection after neoadjuvant chemotherapy. Ultrasound  guided breast biopsy on 04/14/2015 revealed grade III invasive mammary carcinoma with no special type. Lymphovascular invasion was present. Left axillary lymph node biopsy revealed metastatic carcinoma. Tumor was ER positive (1-5%), PR positive (1%) and HER-2/neu positive by FISH. Clinical stagewas T2N1M0.  Mammogram and ultrasoundon 04/07/2013 revealed a 4 cm irregular mass located within the subareolar portion of the left breast with nipple retraction and thickening of the skin of the nipple. There were at least 3 abnormal appearing level I left axillary lymph nodes. PET scanon 05/07/2013 revealed no evidence of metastatic disease.   She enrolled onNSABP B-52and randomized to carboplatin, Taxotere, Herceptin and Perjeta. She received 6 cycles of therapy from 05/10/2013 - 08/23/2013. She began maintenance Herceptinon 09/13/2013 and completed a year of therapy in 04/2014.  Left modified radical mastectomy and axillary lymph node dissection on 10/05/2013 revealed residual focal grade III invasive carcinoma, with microscopic foci, with lymphovascular invasion. Twelve lymph nodes were negative for malignancy. Pathologic stagewas ypT1a(m) ypN0.  She began radiationtherapy in  11/2013. She was complicated by cellulitis of the left chest wall with staph aureus. She began Femara2.5 mg a day in 02/2014.   BCI testing on 08/27/2018 revealed a 14.5% risk of late recurrence (95% CI:  8-20.4%) and a benefit of extended endocrine therapy.  CA27.29 has been followed: 22.9 on 10/04/2013, 15.7 on 08/01/2014, 23.5 on 09/21/2014, 15.2 on 10/31/2014, 16.7 on 03/04/2016, 20.8 on 08/08/2016, 19.6 on 02/10/2017, 23.8 on 08/11/2017, 29.8 on 02/12/2018, 18.0 on 08/13/2018, 30.1 on 02/10/2019, and 20.8 on 10/04/2019.  Right sided mammogramon 08/21/2016 revealed probably benign right breast calcifications. Right diagnostic mammogramon 08/29/2017 revealed a group of indeterminate microcalcifications  measuring 4 x 4 x 3 mm in the lateral slight upper right breast.Biopsyon 10/08/2017 revealed luminal and stromal calcifications associated with microcysts and involuted lobules. There was incidental artery with calcification. There was no atypia or malignancy.  Right diagnostic mammogram on 09/01/2018 revealed no evidence of RIGHT breast malignancy.   Bone density on 10/30/2015 was normalwith a T-score of -0.8 in the right femoral neck. Bone densityon 10/30/2017 revealed osteopenia with a T-score of -1.2 in the right femoral neck.  Symptomatically, she denies any breast concerns.  Exam is stable.  Plan: 1.   Labs today: CBC with diff, CMP, CA27.29. 2.Stage IIB left breast cancer Clinically she is doing well. Exam reveals no evidence of recurrent disease. Right diagnostic mammogram on 09/01/2018 revealed no evidence of malignancy.   BCI testing revealed a benefit of extended adjuvant endocrine therapy.  She began Femara on 02/2014.  Schedule mammogram.    Continue Femara. 3.Osteopenia Patient on calcium and vitamin D. Discuss consideration of Prolia.   Potential side effects reviewed.  Patient notes dental implants.   Discuss dental clearance.  Discuss plans for follow-up bone density on 10/31/2019.  Continue to monitor. 4.   Mild normocytic anemia  Hematocrit 33.5.  Hemoglobin 11.3.  MCV 91.5.  Ferritin 53 with an iron saturation of 9% (low) and a TIBC of 389.  B12 was 252 (low normal) with a folate of 9.1.  B12 goal is 400. 5.   Bilateral mammogram on 10/06/2019. 6.   Bone density on 11/01/2019. 7.   Patient to call after dental clearance for Prolia. 8.   RTC in 6 months for MD assessment, labs (CBC with diff, CMP, CA 27-29), and review of imaging.  Addendum: B12 is low normal.  Ferritin is low with a iron saturation of 9%.  Patient to be contacted and B12 and oral iron supplementation started with  follow-up after 1 month.  I discussed the assessment and treatment plan with the patient.  The patient was provided an opportunity to ask questions and all were answered.  The patient agreed with the plan and demonstrated an understanding of the instructions.  The patient was advised to call back if the symptoms worsen or if the condition fails to improve as anticipated.   Lequita Asal, MD, PhD    10/04/2019, 9:46 PM  I, Jacqualyn Posey, am acting as a Education administrator for [employer]. Mike Gip, MD.   I, Melissa C. Mike Gip, MD, have reviewed the above documentation for accuracy and completeness, and I agree with the above.

## 2019-10-04 ENCOUNTER — Inpatient Hospital Stay: Payer: Medicare PPO | Attending: Hematology and Oncology

## 2019-10-04 ENCOUNTER — Encounter: Payer: Self-pay | Admitting: Hematology and Oncology

## 2019-10-04 ENCOUNTER — Other Ambulatory Visit: Payer: Self-pay

## 2019-10-04 ENCOUNTER — Inpatient Hospital Stay (HOSPITAL_BASED_OUTPATIENT_CLINIC_OR_DEPARTMENT_OTHER): Payer: Medicare PPO | Admitting: Hematology and Oncology

## 2019-10-04 VITALS — BP 126/80 | HR 71 | Temp 97.2°F | Wt 230.7 lb

## 2019-10-04 DIAGNOSIS — I10 Essential (primary) hypertension: Secondary | ICD-10-CM | POA: Insufficient documentation

## 2019-10-04 DIAGNOSIS — Z170 Estrogen receptor positive status [ER+]: Secondary | ICD-10-CM | POA: Diagnosis not present

## 2019-10-04 DIAGNOSIS — D649 Anemia, unspecified: Secondary | ICD-10-CM | POA: Insufficient documentation

## 2019-10-04 DIAGNOSIS — Z79811 Long term (current) use of aromatase inhibitors: Secondary | ICD-10-CM | POA: Insufficient documentation

## 2019-10-04 DIAGNOSIS — C773 Secondary and unspecified malignant neoplasm of axilla and upper limb lymph nodes: Secondary | ICD-10-CM | POA: Insufficient documentation

## 2019-10-04 DIAGNOSIS — C50912 Malignant neoplasm of unspecified site of left female breast: Secondary | ICD-10-CM | POA: Insufficient documentation

## 2019-10-04 DIAGNOSIS — M85851 Other specified disorders of bone density and structure, right thigh: Secondary | ICD-10-CM | POA: Diagnosis not present

## 2019-10-04 LAB — VITAMIN B12: Vitamin B-12: 252 pg/mL (ref 180–914)

## 2019-10-04 LAB — COMPREHENSIVE METABOLIC PANEL
ALT: 20 U/L (ref 0–44)
AST: 24 U/L (ref 15–41)
Albumin: 3.6 g/dL (ref 3.5–5.0)
Alkaline Phosphatase: 86 U/L (ref 38–126)
Anion gap: 7 (ref 5–15)
BUN: 17 mg/dL (ref 8–23)
CO2: 24 mmol/L (ref 22–32)
Calcium: 8.5 mg/dL — ABNORMAL LOW (ref 8.9–10.3)
Chloride: 101 mmol/L (ref 98–111)
Creatinine, Ser: 0.92 mg/dL (ref 0.44–1.00)
GFR calc Af Amer: >60 mL/min (ref >60)
GFR calc non Af Amer: >60 mL/min (ref >60)
Glucose, Bld: 155 mg/dL — ABNORMAL HIGH (ref 70–99)
Potassium: 4 mmol/L (ref 3.5–5.1)
Sodium: 132 mmol/L — ABNORMAL LOW (ref 135–145)
Total Bilirubin: 0.4 mg/dL (ref 0.3–1.2)
Total Protein: 7.2 g/dL (ref 6.5–8.1)

## 2019-10-04 LAB — CBC WITH DIFFERENTIAL/PLATELET
Abs Immature Granulocytes: 0.02 10*3/uL (ref 0.00–0.07)
Basophils Absolute: 0 10*3/uL (ref 0.0–0.1)
Basophils Relative: 1 %
Eosinophils Absolute: 0.1 10*3/uL (ref 0.0–0.5)
Eosinophils Relative: 1 %
HCT: 33.5 % — ABNORMAL LOW (ref 36.0–46.0)
Hemoglobin: 11.3 g/dL — ABNORMAL LOW (ref 12.0–15.0)
Immature Granulocytes: 0 %
Lymphocytes Relative: 21 %
Lymphs Abs: 1.4 10*3/uL (ref 0.7–4.0)
MCH: 30.9 pg (ref 26.0–34.0)
MCHC: 33.7 g/dL (ref 30.0–36.0)
MCV: 91.5 fL (ref 80.0–100.0)
Monocytes Absolute: 0.4 10*3/uL (ref 0.1–1.0)
Monocytes Relative: 6 %
Neutro Abs: 4.7 10*3/uL (ref 1.7–7.7)
Neutrophils Relative %: 71 %
Platelets: 370 10*3/uL (ref 150–400)
RBC: 3.66 MIL/uL — ABNORMAL LOW (ref 3.87–5.11)
RDW: 17 % — ABNORMAL HIGH (ref 11.5–15.5)
WBC: 6.6 10*3/uL (ref 4.0–10.5)
nRBC: 0 % (ref 0.0–0.2)

## 2019-10-04 LAB — IRON AND TIBC
Iron: 36 ug/dL (ref 28–170)
Saturation Ratios: 9 % — ABNORMAL LOW (ref 10.4–31.8)
TIBC: 389 ug/dL (ref 250–450)
UIBC: 353 ug/dL

## 2019-10-04 LAB — FOLATE: Folate: 9.1 ng/mL (ref >5.9)

## 2019-10-04 LAB — FERRITIN: Ferritin: 53 ng/mL (ref 11–307)

## 2019-10-04 NOTE — Patient Instructions (Signed)
Denosumab injection What is this medicine? DENOSUMAB (den oh sue mab) slows bone breakdown. Prolia is used to treat osteoporosis in women after menopause and in men, and in people who are taking corticosteroids for 6 months or more. Xgeva is used to treat a high calcium level due to cancer and to prevent bone fractures and other bone problems caused by multiple myeloma or cancer bone metastases. Xgeva is also used to treat giant cell tumor of the bone. This medicine may be used for other purposes; ask your health care provider or pharmacist if you have questions. COMMON BRAND NAME(S): Prolia, XGEVA What should I tell my health care provider before I take this medicine? They need to know if you have any of these conditions:  dental disease  having surgery or tooth extraction  infection  kidney disease  low levels of calcium or Vitamin D in the blood  malnutrition  on hemodialysis  skin conditions or sensitivity  thyroid or parathyroid disease  an unusual reaction to denosumab, other medicines, foods, dyes, or preservatives  pregnant or trying to get pregnant  breast-feeding How should I use this medicine? This medicine is for injection under the skin. It is given by a health care professional in a hospital or clinic setting. A special MedGuide will be given to you before each treatment. Be sure to read this information carefully each time. For Prolia, talk to your pediatrician regarding the use of this medicine in children. Special care may be needed. For Xgeva, talk to your pediatrician regarding the use of this medicine in children. While this drug may be prescribed for children as young as 13 years for selected conditions, precautions do apply. Overdosage: If you think you have taken too much of this medicine contact a poison control center or emergency room at once. NOTE: This medicine is only for you. Do not share this medicine with others. What if I miss a dose? It is  important not to miss your dose. Call your doctor or health care professional if you are unable to keep an appointment. What may interact with this medicine? Do not take this medicine with any of the following medications:  other medicines containing denosumab This medicine may also interact with the following medications:  medicines that lower your chance of fighting infection  steroid medicines like prednisone or cortisone This list may not describe all possible interactions. Give your health care provider a list of all the medicines, herbs, non-prescription drugs, or dietary supplements you use. Also tell them if you smoke, drink alcohol, or use illegal drugs. Some items may interact with your medicine. What should I watch for while using this medicine? Visit your doctor or health care professional for regular checks on your progress. Your doctor or health care professional may order blood tests and other tests to see how you are doing. Call your doctor or health care professional for advice if you get a fever, chills or sore throat, or other symptoms of a cold or flu. Do not treat yourself. This drug may decrease your body's ability to fight infection. Try to avoid being around people who are sick. You should make sure you get enough calcium and vitamin D while you are taking this medicine, unless your doctor tells you not to. Discuss the foods you eat and the vitamins you take with your health care professional. See your dentist regularly. Brush and floss your teeth as directed. Before you have any dental work done, tell your dentist you are   receiving this medicine. Do not become pregnant while taking this medicine or for 5 months after stopping it. Talk with your doctor or health care professional about your birth control options while taking this medicine. Women should inform their doctor if they wish to become pregnant or think they might be pregnant. There is a potential for serious side  effects to an unborn child. Talk to your health care professional or pharmacist for more information. What side effects may I notice from receiving this medicine? Side effects that you should report to your doctor or health care professional as soon as possible:  allergic reactions like skin rash, itching or hives, swelling of the face, lips, or tongue  bone pain  breathing problems  dizziness  jaw pain, especially after dental work  redness, blistering, peeling of the skin  signs and symptoms of infection like fever or chills; cough; sore throat; pain or trouble passing urine  signs of low calcium like fast heartbeat, muscle cramps or muscle pain; pain, tingling, numbness in the hands or feet; seizures  unusual bleeding or bruising  unusually weak or tired Side effects that usually do not require medical attention (report to your doctor or health care professional if they continue or are bothersome):  constipation  diarrhea  headache  joint pain  loss of appetite  muscle pain  runny nose  tiredness  upset stomach This list may not describe all possible side effects. Call your doctor for medical advice about side effects. You may report side effects to FDA at 1-800-FDA-1088. Where should I keep my medicine? This medicine is only given in a clinic, doctor's office, or other health care setting and will not be stored at home. NOTE: This sheet is a summary. It may not cover all possible information. If you have questions about this medicine, talk to your doctor, pharmacist, or health care provider.  2020 Elsevier/Gold Standard (2017-05-09 16:10:44)

## 2019-10-04 NOTE — Progress Notes (Signed)
No new changes noted today

## 2019-10-05 LAB — CANCER ANTIGEN 27.29: CA 27.29: 20.8 U/mL (ref 0.0–38.6)

## 2019-10-12 ENCOUNTER — Telehealth: Payer: Self-pay

## 2019-10-12 NOTE — Telephone Encounter (Signed)
Spoke with the patient to inform her that her Iron levels and B-12 levels are low and Per Dr Mike Gip she wanted the patient to start Oral B-12 1000 mcg and start Ferrous sulfate 325 mg daily with OJ or Vitamin C.

## 2019-10-12 NOTE — Telephone Encounter (Signed)
The patient was understanding and agreeable.

## 2019-11-01 ENCOUNTER — Ambulatory Visit
Admission: RE | Admit: 2019-11-01 | Discharge: 2019-11-01 | Disposition: A | Discharge status: Home / Self Care | Payer: Medicare PPO | Source: Ambulatory Visit | Attending: Hematology and Oncology | Admitting: Hematology and Oncology

## 2019-11-01 ENCOUNTER — Other Ambulatory Visit: Payer: Self-pay

## 2019-11-01 DIAGNOSIS — Z923 Personal history of irradiation: Secondary | ICD-10-CM | POA: Insufficient documentation

## 2019-11-01 DIAGNOSIS — E039 Hypothyroidism, unspecified: Secondary | ICD-10-CM | POA: Insufficient documentation

## 2019-11-01 DIAGNOSIS — M85852 Other specified disorders of bone density and structure, left thigh: Secondary | ICD-10-CM | POA: Insufficient documentation

## 2019-11-01 DIAGNOSIS — Z9221 Personal history of antineoplastic chemotherapy: Secondary | ICD-10-CM | POA: Diagnosis not present

## 2019-11-01 DIAGNOSIS — Z780 Asymptomatic menopausal state: Secondary | ICD-10-CM | POA: Insufficient documentation

## 2019-11-01 DIAGNOSIS — Z853 Personal history of malignant neoplasm of breast: Secondary | ICD-10-CM | POA: Diagnosis not present

## 2019-11-01 DIAGNOSIS — M85851 Other specified disorders of bone density and structure, right thigh: Secondary | ICD-10-CM | POA: Insufficient documentation

## 2019-11-01 DIAGNOSIS — Z1231 Encounter for screening mammogram for malignant neoplasm of breast: Secondary | ICD-10-CM | POA: Diagnosis not present

## 2019-11-01 DIAGNOSIS — C50912 Malignant neoplasm of unspecified site of left female breast: Secondary | ICD-10-CM

## 2019-11-01 DIAGNOSIS — Z13820 Encounter for screening for osteoporosis: Secondary | ICD-10-CM | POA: Diagnosis not present

## 2019-11-01 DIAGNOSIS — Z170 Estrogen receptor positive status [ER+]: Secondary | ICD-10-CM | POA: Insufficient documentation

## 2019-11-02 ENCOUNTER — Telehealth: Payer: Self-pay

## 2019-11-02 NOTE — Telephone Encounter (Signed)
Spoke with the patient to inform her that ther has been no significant changes in the spine and ther has been a sinificant decrease in the total hip. The scan quality is good. The patient was understanding and agreeable.

## 2019-11-02 NOTE — Telephone Encounter (Signed)
-----   Message from Lequita Asal, MD sent at 11/01/2019  4:09 PM EDT ----- Regarding: Please call patient  Review bone density. ----- Message ----- From: Interface, Rad Results In Sent: 11/01/2019   2:41 PM EDT To: Lequita Asal, MD

## 2019-11-12 ENCOUNTER — Other Ambulatory Visit: Payer: Self-pay | Admitting: Hematology and Oncology

## 2020-01-31 ENCOUNTER — Other Ambulatory Visit: Admission: RE | Admit: 2020-01-31 | Payer: Medicare PPO | Source: Ambulatory Visit

## 2020-02-01 ENCOUNTER — Other Ambulatory Visit: Payer: Self-pay

## 2020-02-01 ENCOUNTER — Other Ambulatory Visit
Admission: RE | Admit: 2020-02-01 | Discharge: 2020-02-01 | Disposition: A | Discharge status: Home / Self Care | Payer: Medicare PPO | Source: Ambulatory Visit | Attending: Internal Medicine | Admitting: Internal Medicine

## 2020-02-01 DIAGNOSIS — Z20822 Contact with and (suspected) exposure to covid-19: Secondary | ICD-10-CM | POA: Diagnosis not present

## 2020-02-01 DIAGNOSIS — Z01812 Encounter for preprocedural laboratory examination: Secondary | ICD-10-CM | POA: Diagnosis present

## 2020-02-01 LAB — SARS CORONAVIRUS 2 (TAT 6-24 HRS): SARS Coronavirus 2: NEGATIVE

## 2020-02-02 ENCOUNTER — Encounter: Admission: RE | Payer: Self-pay | Source: Home / Self Care

## 2020-02-02 ENCOUNTER — Ambulatory Visit: Admission: RE | Admit: 2020-02-02 | Payer: Medicare PPO | Source: Home / Self Care | Admitting: Internal Medicine

## 2020-02-02 SURGERY — COLONOSCOPY WITH PROPOFOL
Anesthesia: General

## 2020-02-28 ENCOUNTER — Other Ambulatory Visit: Payer: Self-pay

## 2020-02-28 ENCOUNTER — Other Ambulatory Visit
Admission: RE | Admit: 2020-02-28 | Discharge: 2020-02-28 | Disposition: A | Discharge status: Home / Self Care | Payer: Medicare PPO | Source: Ambulatory Visit | Attending: Internal Medicine | Admitting: Internal Medicine

## 2020-02-28 DIAGNOSIS — U071 COVID-19: Secondary | ICD-10-CM | POA: Diagnosis not present

## 2020-02-28 DIAGNOSIS — Z01812 Encounter for preprocedural laboratory examination: Secondary | ICD-10-CM | POA: Insufficient documentation

## 2020-02-29 LAB — SARS CORONAVIRUS 2 (TAT 6-24 HRS): SARS Coronavirus 2: POSITIVE — AB

## 2020-03-01 ENCOUNTER — Telehealth: Payer: Self-pay

## 2020-03-01 NOTE — Telephone Encounter (Signed)
Called to discuss with patient about COVID-19 symptoms and the use of one of the available treatments for those with mild to moderate Covid symptoms and at a high risk of hospitalization.  Pt appears to qualify for outpatient treatment due to co-morbid conditions and/or a member of an at-risk group in accordance with the FDA Emergency Use Authorization.    Symptom onset: Unknown Vaccinated: Unknown Booster? Unknown Immunocompromised? No Qualifiers: HTN  Unable to reach pt - Left message with call back number [phone].  Mary Brady

## 2020-03-02 ENCOUNTER — Telehealth: Payer: Self-pay | Admitting: Family

## 2020-03-02 NOTE — Telephone Encounter (Addendum)
Called to discuss with patient about COVID-19 symptoms and the use of one of the available treatments for those with mild to moderate Covid symptoms and at a high risk of hospitalization.  Pt appears to qualify for outpatient treatment due to co-morbid conditions and/or a member of an at-risk group in accordance with the FDA Emergency Use Authorization.     Unable to reach pt at either number number listed- VM left on cell number.  [employer]

## 2020-03-03 ENCOUNTER — Telehealth: Payer: Self-pay | Admitting: Unknown Physician Specialty

## 2020-03-03 NOTE — Telephone Encounter (Signed)
Called to discuss with patient about COVID-19 symptoms and the use of one of the available treatments for those with mild to moderate Covid symptoms and at a high risk of hospitalization.  Pt appears to qualify for outpatient treatment due to co-morbid conditions and/or a member of an at-risk group in accordance with the FDA Emergency Use Authorization.    Symptom onset: 2 weeks  She is doing better and recovering  Kathrine Haddock

## 2020-03-29 NOTE — Progress Notes (Incomplete)
[facility]  [address] [place], [place] [address] Phone: [phone]  Fax: [phone]  Office Visit: 03/29/20  Referring physician: Maryland Pink, MD  Chief Complaint: Mary Brady is a 70 y.o. female with stage IIB left breast cancer who is seen for 6 month assessment.  HPI: The patient was last seen virtually in the medical oncology clinic on 10/04/2019. At that time, she denies any breast concerns.  Exam is stable. Hematocrit was 33.5, hemoglobin 11.3, MCV 91.5, platelets 370,000, WBC 6,600. Sodium was 132. Calcium was 8.5. Ferritin was 53 with an iron saturation of 9% and a TIBC of 389. Vitamin B12 was 252, folate 9.1. CA27.29 was 20.8. She continued Femara, calcium, and vitamin D.  Right screening mammogram on 11/01/2019 revealed no evidence of malignancy.  Bone density on 11/01/2019 revealed osteopenia with a T score of -1.7 at the left femur neck.  The patient tested positive for COVID-19 on 02/28/2020.  Colonoscopy is scheduled for 04/26/2020 by Dr. Alice Reichert.  During the interim, ***   Past Medical History:  Diagnosis Date  . Arthritis   . Breast cancer ([place]) 2015   LT MASTECTOMY  . Cataract   . Depression   . Diabetes mellitus without complication ([place])   . Dysfunctional uterine bleeding   . History of colonic polyps   . Hypertension   . Hypothyroidism 07/09/2007  . Personal history of chemotherapy 2015   BREAST CA  . Personal history of radiation therapy 2015   BREAST CA    Past Surgical History:  Procedure Laterality Date  . BREAST BIOPSY Left 03/2013   positive  . BREAST BIOPSY Right 10/08/2017   rt stereo x clip- LUMINAL AND STROMAL CALCIFICATIONS ASSOCIATED WITH MICROCYSTS AND   . CATARACT EXTRACTION Bilateral 02/08/2013  . CATARACT EXTRACTION W/PHACO Left 01/20/2018   Procedure: CATARACT EXTRACTION PHACO AND INTRAOCULAR LENS PLACEMENT ([place])  LEFT DIABETIC;  Surgeon: Leandrew Koyanagi, MD;  Location: [facility];  Service: Ophthalmology;  Laterality: Left;  Diabetic  . COLONOSCOPY    . COLONOSCOPY WITH PROPOFOL N/A 12/01/2014   Procedure: COLONOSCOPY WITH PROPOFOL;  Surgeon: Manya Silvas, MD;  Location: [facility] ENDOSCOPY;  Service: Endoscopy;  Laterality: N/A;  . ENDOMETRIAL BIOPSY    . MASTECTOMY MODIFIED RADICAL Left 10/05/2013   BREAST CA  . SKIN BIOPSY      Family History  Problem Relation Age of Onset  . Cancer Other   . Cancer Cousin   . Breast cancer Cousin     Social History:  reports that she has never smoked. She has never used smokeless tobacco. She reports that she does not drink alcohol and does not use drugs. She is a retired Sales executive. She lives in [place]. The patient is alone ***today.   Allergies:  Allergies  Allergen Reactions  . Demerol [Meperidine] Other (See Comments)    "Hyper"  . Penicillins Rash    Current Medications: Current Outpatient Medications  Medication Sig Dispense Refill  . ACCU-CHEK AVIVA PLUS test strip USE 2 (TWO) TIMES DAILY    . acetaminophen (TYLENOL) 500 MG tablet Take 1,000 mg by mouth every 6 (six) hours as needed (for pain).    . bisoprolol-hydrochlorothiazide (ZIAC) 5-6.25 MG per tablet Take 1 tablet by mouth at bedtime.    . Calcium Carb-Cholecalciferol 500-400 MG-UNIT TABS Take 1 tablet by mouth 2 (two) times daily.    . cetirizine (ZYRTEC) 10 MG tablet Take 10 mg by mouth daily.     Marland Kitchen  FLUoxetine (PROZAC) 20 MG tablet Take 20 mg by mouth daily.    Marland Kitchen glimepiride (AMARYL) 4 MG tablet Take 4 mg by mouth 2 (two) times daily.    Marland Kitchen KLOR-CON M20 20 MEQ tablet TAKE 1 TABLET BY MOUTH ONCE A DAY 30 tablet 6  . Lancets MISC Use 1 each 2 (two) times daily    . letrozole (FEMARA) 2.5 MG tablet TAKE 1 TABLET BY MOUTH EVERY DAY 90 tablet 1  . levothyroxine (SYNTHROID) 200 MCG tablet Take 200 mcg by mouth daily before breakfast.     . magnesium chloride (SLOW-MAG) 64 MG TBEC SR tablet Take 1 tablet by mouth 2 (two) times  daily.    . meloxicam (MOBIC) 15 MG tablet Take 15 mg by mouth as needed.     . pioglitazone (ACTOS) 30 MG tablet Take 30 mg by mouth daily.     . simvastatin (ZOCOR) 20 MG tablet Take 20 mg by mouth at bedtime.    . TRULICITY 1.5 TG/2.5WL SOPN INJECT 1.5 MG SUBCUTANEOUSLY EVERY 7 (SEVEN) DAYS     No current facility-administered medications for this visit.    Review of Systems  Constitutional: Negative for chills, diaphoresis, fever, malaise/fatigue and weight loss.       Feels "ok".  HENT: Negative.  Negative for congestion, ear pain, nosebleeds, sinus pain and sore throat.        Tooth extracted 5 weeks ago.  Eyes: Negative.  Negative for double vision, photophobia and pain.       S/p left cataract surgery (vision improved).  Respiratory: Negative.  Negative for cough, hemoptysis, sputum production and shortness of breath.   Cardiovascular: Positive for chest pain (occasional breast tenderness and discomfort). Negative for palpitations, orthopnea, leg swelling and PND.  Gastrointestinal: Negative.  Negative for abdominal pain, blood in stool, constipation, diarrhea, melena, nausea and vomiting.  Genitourinary: Negative.  Negative for dysuria, frequency, hematuria and urgency.  Musculoskeletal: Positive for joint pain (bilateral knee). Negative for back pain, falls, myalgias and neck pain.  Skin: Negative.  Negative for itching and rash.  Neurological: Negative.  Negative for dizziness, tremors, speech change, focal weakness, weakness and headaches.  Endo/Heme/Allergies: Does not bruise/bleed easily.       Diabetes.  Hemoglobin A1c is 9.  Feels better on thyroid medication.  Psychiatric/Behavioral: Negative.  Negative for depression and memory loss. The patient is not nervous/anxious and does not have insomnia.   All other systems reviewed and are negative.  Performance status (ECOG): 0***   Vital Signs There were no vitals taken for this visit.  Physical Exam Nursing note  reviewed.  Constitutional:      General: She is not in acute distress.    Appearance: Normal appearance. She is well-developed. She is not diaphoretic.     Interventions: Face mask in place.  HENT:     Head: Normocephalic and atraumatic.     Comments: Short gray hair.    Mouth/Throat:     Mouth: No oral lesions.  Eyes:     General: No scleral icterus.    Conjunctiva/sclera: Conjunctivae normal.     Pupils: Pupils are equal, round, and reactive to light.     Comments: Brown eyes.  Cardiovascular:     Rate and Rhythm: Normal rate and regular rhythm.     Heart sounds: Normal heart sounds. No murmur heard. No friction rub. No gallop.   Pulmonary:     Effort: Pulmonary effort is normal.     Breath sounds: Normal  breath sounds. No wheezing, rhonchi or rales.  Chest:  Breasts:     Right: No axillary adenopathy or supraclavicular adenopathy.     Left: No axillary adenopathy or supraclavicular adenopathy.    Abdominal:     General: Bowel sounds are normal.     Palpations: Abdomen is soft. There is no mass.     Tenderness: There is no abdominal tenderness.  Musculoskeletal:        General: No tenderness. Normal range of motion.     Cervical back: Normal range of motion and neck supple.  Lymphadenopathy:     Head:     Right side of head: No preauricular, posterior auricular or occipital adenopathy.     Left side of head: No preauricular, posterior auricular or occipital adenopathy.     Cervical: No cervical adenopathy.     Right cervical: No superficial cervical adenopathy.    Upper Body:     Right upper body: No supraclavicular or axillary adenopathy.     Left upper body: No supraclavicular or axillary adenopathy.     Lower Body: No right inguinal adenopathy. No left inguinal adenopathy.  Skin:    General: Skin is warm and dry.     Findings: No bruising, erythema, lesion or rash.  Neurological:     Mental Status: She is alert and oriented to person, place, and time.   Psychiatric:        Behavior: Behavior normal.        Thought Content: Thought content normal.        Judgment: Judgment normal.     No visits with results within 3 Day(s) from this visit.  Latest known visit with results is:  Hospital Outpatient Visit on 02/28/2020  Component Date Value Ref Range Status  . SARS Coronavirus 2 02/28/2020 POSITIVE* NEGATIVE Final   Comment: (NOTE) SARS-CoV-2 target nucleic acids are DETECTED.  The SARS-CoV-2 RNA is generally detectable in upper and lower respiratory specimens during the acute phase of infection. Positive results are indicative of the presence of SARS-CoV-2 RNA. Clinical correlation with patient history and other diagnostic information is  necessary to determine patient infection status. Positive results do not rule out bacterial infection or co-infection with other viruses.  The expected result is Negative.  Fact Sheet for Patients: SugarRoll.be  Fact Sheet for Healthcare Providers: https://www.woods-mathews.com/  This test is not yet approved or cleared by the [place] FDA and  has been authorized for detection and/or diagnosis of SARS-CoV-2 by FDA under an Emergency Use Authorization (EUA). This EUA will remain  in effect (meaning this test can be used) for the duration of the COVID-19 declaration under Section 564(b)(1) of the Act, 21 U.                          S.C. section 360bbb-3(b)(1), unless the authorization is terminated or revoked sooner.   Performed at [facility], [place] [address], [place], [place] [address]     Assessment:  Mary Brady is a 70 y.o. female with clinical stage IIB Her2/neu + left breast cancers/p mastectomy and axillary lymph node dissection after neoadjuvant chemotherapy. Ultrasound guided breast biopsy on 04/14/2015 revealed grade III invasive mammary carcinoma with no special type. Lymphovascular invasion was present. Left axillary  lymph node biopsy revealed metastatic carcinoma. Tumor was ER positive (1-5%), PR positive (1%) and HER-2/neu positive by FISH. Clinical stagewas T2N1M0.  Mammogram and ultrasoundon 04/07/2013 revealed a 4 cm irregular  mass located within the subareolar portion of the left breast with nipple retraction and thickening of the skin of the nipple. There were at least 3 abnormal appearing level I left axillary lymph nodes. PET scanon 05/07/2013 revealed no evidence of metastatic disease.   She enrolled onNSABP B-52and randomized to carboplatin, Taxotere, Herceptin and Perjeta. She received 6 cycles of therapy from 05/10/2013 - 08/23/2013. She began maintenance Herceptinon 09/13/2013 and completed a year of therapy in 04/2014.  Left modified radical mastectomy and axillary lymph node dissection on 10/05/2013 revealed residual focal grade III invasive carcinoma, with microscopic foci, with lymphovascular invasion. Twelve lymph nodes were negative for malignancy. Pathologic stagewas ypT1a(m) ypN0.  She began radiationtherapy in 11/2013. She was complicated by cellulitis of the left chest wall with staph aureus. She began Femara2.5 mg a day in 02/2014.   BCI testing on 08/27/2018 revealed a 14.5% risk of late recurrence (95% CI:  8-20.4%) and a benefit of extended endocrine therapy.  CA27.29 has been followed: 22.9 on 10/04/2013, 15.7 on 08/01/2014, 23.5 on 09/21/2014, 15.2 on 10/31/2014, 16.7 on 03/04/2016, 20.8 on 08/08/2016, 19.6 on 02/10/2017, 23.8 on 08/11/2017, 29.8 on 02/12/2018, 18.0 on 08/13/2018, 30.1 on 02/10/2019, and 20.8 on 10/04/2019.  Right sided mammogramon 08/21/2016 revealed probably benign right breast calcifications. Right diagnostic mammogramon 08/29/2017 revealed a group of indeterminate microcalcifications measuring 4 x 4 x 3 mm in the lateral slight upper right breast.Biopsyon 10/08/2017 revealed luminal and stromal calcifications associated with microcysts  and involuted lobules. There was incidental artery with calcification. There was no atypia or malignancy.  Right diagnostic mammogram on 09/01/2018 revealed no evidence of RIGHT breast malignancy. Right screening mammogram on 11/01/2019 revealed no evidence of malignancy.    Bone density on 10/30/2015 was normalwith a T-score of -0.8 in the right femoral neck. Bone densityon 10/30/2017 revealed osteopenia with a T-score of -1.2 in the right femoral neck. Bone density on 11/01/2019 revealed osteopenia with a T score of -1.7 at the left femur neck.  She has B12 deficiency.  B12 was 252 on 10/04/2019.  The patient tested positive for COVID-19 on 02/28/2020.  Symptomatically, ***  Plan: 1.   Labs today: CBC with diff, CMP, CA 27.29.   2.Stage IIB left breast cancer Clinically she is doing well. Exam reveals no evidence of recurrent disease. Right diagnostic mammogram on 09/01/2018 revealed no evidence of malignancy.   BCI testing revealed a benefit of extended adjuvant endocrine therapy.  She began Femara on 02/2014.  Schedule mammogram.    Continue Femara. 3.Osteopenia Patient on calcium and vitamin D. Discuss consideration of Prolia.   Potential side effects reviewed.  Patient notes dental implants.   Discuss dental clearance.  Discuss plans for follow-up bone density on 10/31/2019.  Continue to monitor. 4.   Mild normocytic anemia  Hematocrit 33.5.  Hemoglobin 11.3.  MCV 91.5.  Ferritin 53 with an iron saturation of 9% (low) and a TIBC of 389.  B12 was 252 (low normal) with a folate of 9.1.  B12 goal is 400. 5.   Bilateral mammogram on 10/06/2019. 6.   Bone density on 11/01/2019. 7.   Patient to call after dental clearance for Prolia. 8.   RTC in 6 months for MD assessment, labs (CBC with diff, CMP, CA 27-29), and review of imaging.  Addendum: B12 is low normal.  Ferritin is low with a iron  saturation of 9%.  Patient to be contacted and B12 and oral iron supplementation started with follow-up after 1 month.  I  discussed the assessment and treatment plan with the patient.  The patient was provided an opportunity to ask questions and all were answered.  The patient agreed with the plan and demonstrated an understanding of the instructions.  The patient was advised to call back if the symptoms worsen or if the condition fails to improve as anticipated.  I provided *** minutes of face-to-face time during this this encounter and > 50% was spent counseling as documented under my assessment and plan.   Lequita Asal, MD, PhD    04/02/2020, 6:13 AM   I, Mirian Mo Tufford, am acting as a Education administrator for [employer]. Mike Gip, MD.   I, Melissa C. Mike Gip, MD, have reviewed the above documentation for accuracy and completeness, and I agree with the above.

## 2020-04-03 ENCOUNTER — Other Ambulatory Visit: Payer: Medicare PPO

## 2020-04-03 ENCOUNTER — Ambulatory Visit: Payer: Medicare PPO | Admitting: Hematology and Oncology

## 2020-04-03 DIAGNOSIS — D649 Anemia, unspecified: Secondary | ICD-10-CM

## 2020-04-03 DIAGNOSIS — C50912 Malignant neoplasm of unspecified site of left female breast: Secondary | ICD-10-CM

## 2020-04-03 DIAGNOSIS — M85851 Other specified disorders of bone density and structure, right thigh: Secondary | ICD-10-CM

## 2020-05-15 ENCOUNTER — Encounter: Payer: Self-pay | Admitting: Hematology and Oncology

## 2020-06-27 ENCOUNTER — Encounter: Payer: Self-pay | Admitting: Internal Medicine

## 2020-06-28 ENCOUNTER — Ambulatory Visit: Payer: Medicare PPO | Admitting: Anesthesiology

## 2020-06-28 ENCOUNTER — Encounter
Admission: RE | Disposition: A | Discharge status: Home / Self Care | Payer: Self-pay | Source: Home / Self Care | Attending: Internal Medicine

## 2020-06-28 ENCOUNTER — Ambulatory Visit
Admission: RE | Admit: 2020-06-28 | Discharge: 2020-06-28 | Disposition: A | Discharge status: Home / Self Care | Payer: Medicare PPO | Attending: Internal Medicine | Admitting: Internal Medicine

## 2020-06-28 ENCOUNTER — Encounter: Payer: Self-pay | Admitting: Internal Medicine

## 2020-06-28 DIAGNOSIS — Z79899 Other long term (current) drug therapy: Secondary | ICD-10-CM | POA: Diagnosis not present

## 2020-06-28 DIAGNOSIS — Z880 Allergy status to penicillin: Secondary | ICD-10-CM | POA: Diagnosis not present

## 2020-06-28 DIAGNOSIS — Z1211 Encounter for screening for malignant neoplasm of colon: Secondary | ICD-10-CM | POA: Insufficient documentation

## 2020-06-28 DIAGNOSIS — Z86010 Personal history of colonic polyps: Secondary | ICD-10-CM | POA: Insufficient documentation

## 2020-06-28 DIAGNOSIS — Z885 Allergy status to narcotic agent status: Secondary | ICD-10-CM | POA: Diagnosis not present

## 2020-06-28 DIAGNOSIS — Z7984 Long term (current) use of oral hypoglycemic drugs: Secondary | ICD-10-CM | POA: Insufficient documentation

## 2020-06-28 DIAGNOSIS — K640 First degree hemorrhoids: Secondary | ICD-10-CM | POA: Diagnosis not present

## 2020-06-28 DIAGNOSIS — K5730 Diverticulosis of large intestine without perforation or abscess without bleeding: Secondary | ICD-10-CM | POA: Insufficient documentation

## 2020-06-28 DIAGNOSIS — Z79890 Hormone replacement therapy (postmenopausal): Secondary | ICD-10-CM | POA: Diagnosis not present

## 2020-06-28 HISTORY — PX: COLONOSCOPY WITH PROPOFOL: SHX5780

## 2020-06-28 SURGERY — COLONOSCOPY WITH PROPOFOL
Anesthesia: General

## 2020-06-28 MED ORDER — PHENYLEPHRINE HCL (PRESSORS) 10 MG/ML IV SOLN
INTRAVENOUS | Status: DC | PRN
  Administered 2020-06-28 (×2): 100 ug via INTRAVENOUS

## 2020-06-28 MED ORDER — SODIUM CHLORIDE 0.9 % IV SOLN: INTRAVENOUS | Status: DC

## 2020-06-28 MED ORDER — PROPOFOL 500 MG/50ML IV EMUL
INTRAVENOUS | Status: AC
  Filled 2020-06-28: qty 50

## 2020-06-28 MED ORDER — PROPOFOL 500 MG/50ML IV EMUL
INTRAVENOUS | Status: DC | PRN
  Administered 2020-06-28: 125 ug/kg/min via INTRAVENOUS

## 2020-06-28 MED ORDER — PROPOFOL 10 MG/ML IV BOLUS
INTRAVENOUS | Status: DC | PRN
  Administered 2020-06-28: 50 mg via INTRAVENOUS

## 2020-06-28 NOTE — Transfer of Care (Signed)
Immediate Anesthesia Transfer of Care Note  Patient: Mary Brady  Procedure(s) Performed: COLONOSCOPY WITH PROPOFOL  Patient Location: PACU  Anesthesia Type:General  Level of Consciousness: awake and alert   Airway & Oxygen Therapy: Patient Spontanous Breathing and Patient connected to nasal cannula oxygen  Post-op Assessment: Report given to RN and Post -op Vital signs reviewed and stable  Post vital signs: Reviewed and stable  Last Vitals:  Vitals Value Taken Time  BP 100/48 06/28/20 0902  Temp    Pulse 76 06/28/20 0903  Resp 19 06/28/20 0903  SpO2 98 % 06/28/20 0903  Vitals shown include unvalidated device data.  Last Pain:  Vitals:   06/28/20 0902  TempSrc:   PainSc: 0-No pain         Complications: No notable events documented.

## 2020-06-28 NOTE — Op Note (Signed)
[facility] Patient Name: Ann-Marie Kluge Procedure Date: 06/28/2020 7:25 AM MRN: [id number] Account #: [id number] Date of Birth: Sep 07, 1950 Admit Type: Inpatient Age: 70 Room: [facility] ENDO ROOM 2 Gender: Female Note Status: Finalized Procedure:             Colonoscopy Indications:           Surveillance: Personal history of adenomatous polyps                         on last colonoscopy > 5 years ago Providers:             Lorie Apley K. Alice Reichert MD, MD Referring MD:          Irven Easterly. Kary Kos, MD (Referring MD) Medicines:             Propofol per Anesthesia Complications:         No immediate complications. Procedure:             Pre-Anesthesia Assessment:                        - The risks and benefits of the procedure and the                         sedation options and risks were discussed with the                         patient. All questions were answered and informed                         consent was obtained.                        - Patient identification and proposed procedure were                         verified prior to the procedure by the nurse. The                         procedure was verified in the procedure room.                        - ASA Grade Assessment: II - A patient with mild                         systemic disease.                        - After reviewing the risks and benefits, the patient                         was deemed in satisfactory condition to undergo the                         procedure.                        After obtaining informed consent, the colonoscope was                         passed under direct  vision. Throughout the procedure,                         the patient's blood pressure, pulse, and oxygen                         saturations were monitored continuously. The                         Colonoscope was introduced through the anus and                         advanced to the the cecum, identified by  appendiceal                         orifice and ileocecal valve. The colonoscopy was                         somewhat difficult due to restricted mobility of the                         colon. Successful completion of the procedure was                         aided by changing the patient to a prone position. The                         patient tolerated the procedure well. The quality of                         the bowel preparation was adequate. The ileocecal                         valve, appendiceal orifice, and rectum were                         photographed. Findings:      The perianal and digital rectal examinations were normal. Pertinent       negatives include normal sphincter tone and no palpable rectal lesions.      Non-bleeding internal hemorrhoids were found during retroflexion. The       hemorrhoids were Grade I (internal hemorrhoids that do not prolapse).      Multiple small and large-mouthed diverticula were found in the sigmoid       colon. There was no evidence of diverticular bleeding.      A few small and large-mouthed diverticula were found in the descending       colon, transverse colon and ascending colon. There was no evidence of       diverticular bleeding.      The exam was otherwise without abnormality. Impression:            - Non-bleeding internal hemorrhoids.                        - Moderate diverticulosis in the sigmoid colon. There                         was no evidence of diverticular bleeding.                        -  Mild diverticulosis in the descending colon, in the                         transverse colon and in the ascending colon. There was                         no evidence of diverticular bleeding.                        - The examination was otherwise normal.                        - No specimens collected. Recommendation:        - Patient has a contact number available for                         emergencies. The signs and symptoms of  potential                         delayed complications were discussed with the patient.                         Return to normal activities tomorrow. Written                         discharge instructions were provided to the patient.                        - Resume previous diet.                        - Continue present medications.                        - Repeat colonoscopy in 5 years for screening purposes.                        - Patient has family history of adenomatous colon                         polyps                        - Return to GI office PRN.                        - The findings and recommendations were discussed with                         the patient. Procedure Code(s):     --- Professional ---                        T4196, Colorectal cancer screening; colonoscopy on                         individual at high risk Diagnosis Code(s):     --- Professional ---                        K57.30, Diverticulosis of large intestine without  perforation or abscess without bleeding                        K64.0, First degree hemorrhoids                        Z86.010, Personal history of colonic polyps CPT copyright 2019 American Medical Association. All rights reserved. The codes documented in this report are preliminary and upon coder review may  be revised to meet current compliance requirements. Efrain Sella MD, MD 06/28/2020 9:03:15 AM This report has been signed electronically. Number of Addenda: 0 Note Initiated On: 06/28/2020 7:25 AM Scope Withdrawal Time: 0 hours 6 minutes 1 second  Total Procedure Duration: 0 hours 10 minutes 31 seconds  Estimated Blood Loss:  Estimated blood loss: none.      [facility]

## 2020-06-28 NOTE — Interval H&P Note (Signed)
History and Physical Interval Note:  06/28/2020 8:30 AM  Mary Brady  has presented today for surgery, with the diagnosis of HX POLYPS.  The various methods of treatment have been discussed with the patient and family. After consideration of risks, benefits and other options for treatment, the patient has consented to  Procedure(s): COLONOSCOPY WITH PROPOFOL (N/A) as a surgical intervention.  The patient's history has been reviewed, patient examined, no change in status, stable for surgery.  I have reviewed the patient's chart and labs.  Questions were answered to the patient's satisfaction.     [place], [place]

## 2020-06-28 NOTE — Anesthesia Preprocedure Evaluation (Signed)
Anesthesia Evaluation  Patient identified by MRN, date of birth, ID band Patient awake    Reviewed: Allergy & Precautions, NPO status , Patient's Chart, lab work & pertinent test results  History of Anesthesia Complications Negative for: history of anesthetic complications  Airway Mallampati: II  TM Distance: >3 FB Neck ROM: Full    Dental  (+) Missing   Pulmonary neg pulmonary ROS, neg sleep apnea, neg COPD,    breath sounds clear to auscultation- rhonchi (-) wheezing      Cardiovascular Exercise Tolerance: Good hypertension, Pt. on medications (-) CAD, (-) Past MI, (-) Cardiac Stents and (-) CABG  Rhythm:Regular Rate:Normal - Systolic murmurs and - Diastolic murmurs    Neuro/Psych neg Seizures PSYCHIATRIC DISORDERS Depression negative neurological ROS     GI/Hepatic negative GI ROS, Neg liver ROS,   Endo/Other  diabetes, Oral Hypoglycemic AgentsHypothyroidism   Renal/GU negative Renal ROS     Musculoskeletal  (+) Arthritis ,   Abdominal (+) + obese,   Peds  Hematology  (+) anemia ,   Anesthesia Other Findings Past Medical History: No date: Arthritis 2015: Breast cancer ([place])     Comment:  LT MASTECTOMY No date: Cataract No date: Depression No date: Diabetes mellitus without complication ([facility]) No date: Dysfunctional uterine bleeding No date: History of colonic polyps No date: Hypertension 07/09/2007: Hypothyroidism 2015: Personal history of chemotherapy     Comment:  BREAST CA 2015: Personal history of radiation therapy     Comment:  BREAST CA   Reproductive/Obstetrics                             Anesthesia Physical Anesthesia Plan  ASA: 3  Anesthesia Plan: General   Post-op Pain Management:    Induction: Intravenous  PONV Risk Score and Plan: 2 and Propofol infusion  Airway Management Planned: Natural Airway  Additional Equipment:   Intra-op Plan:    Post-operative Plan:   Informed Consent: I have reviewed the patients History and Physical, chart, labs and discussed the procedure including the risks, benefits and alternatives for the proposed anesthesia with the patient or authorized representative who has indicated his/her understanding and acceptance.     Dental advisory given  Plan Discussed with: CRNA and Anesthesiologist  Anesthesia Plan Comments:         Anesthesia Quick Evaluation

## 2020-06-28 NOTE — H&P (Signed)
Outpatient short stay form Pre-procedure 06/28/2020 8:28 AM Graesyn Schreifels K. Alice Reichert, M.D.  Primary Physician: Maryland Pink, M.D.  Reason for visit:  Personal history of colon polyps (Tubular adenoma x 1 -2016 colonoscopy)  History of present illness:                            Patient presents for colonoscopy for a personal hx of colon polyps. The patient denies abdominal pain, abnormal weight loss or rectal bleeding.      Current Facility-Administered Medications:    0.9 %  sodium chloride infusion, , Intravenous, Continuous, Allisha Harter, Benay Pike, MD  Medications Prior to Admission  Medication Sig Dispense Refill Last Dose   cetirizine (ZYRTEC) 10 MG tablet Take 10 mg by mouth daily.    06/27/2020   cyanocobalamin 1000 MCG tablet Take 1,000 mcg by mouth daily.      ferrous sulfate 325 (65 FE) MG tablet Take 325 mg by mouth daily with breakfast.   06/27/2020   FLUoxetine (PROZAC) 20 MG tablet Take 20 mg by mouth daily.   06/27/2020   glimepiride (AMARYL) 4 MG tablet Take 4 mg by mouth 2 (two) times daily.   06/27/2020   KLOR-CON M20 20 MEQ tablet TAKE 1 TABLET BY MOUTH ONCE A DAY 30 tablet 6 06/27/2020   letrozole (FEMARA) 2.5 MG tablet TAKE 1 TABLET BY MOUTH EVERY DAY 90 tablet 1 06/27/2020   magnesium chloride (SLOW-MAG) 64 MG TBEC SR tablet Take 1 tablet by mouth 2 (two) times daily.   06/27/2020   pioglitazone (ACTOS) 30 MG tablet Take 30 mg by mouth daily.    06/27/2020   simvastatin (ZOCOR) 20 MG tablet Take 20 mg by mouth at bedtime.   06/27/2020   ACCU-CHEK AVIVA PLUS test strip USE 2 (TWO) TIMES DAILY      acetaminophen (TYLENOL) 500 MG tablet Take 1,000 mg by mouth every 6 (six) hours as needed (for pain).      bisoprolol-hydrochlorothiazide (ZIAC) 5-6.25 MG per tablet Take 1 tablet by mouth at bedtime.      Calcium Carb-Cholecalciferol 500-400 MG-UNIT TABS Take 1 tablet by mouth 2 (two) times daily.      Lancets MISC Use 1 each 2 (two) times daily      levothyroxine (SYNTHROID) 200 MCG  tablet Take 200 mcg by mouth daily before breakfast.       meloxicam (MOBIC) 15 MG tablet Take 15 mg by mouth as needed.       TRULICITY 1.5 TG/6.2IR SOPN INJECT 1.5 MG SUBCUTANEOUSLY EVERY 7 (SEVEN) DAYS        Allergies  Allergen Reactions   Demerol [Meperidine] Other (See Comments)    "Hyper"   Penicillins Rash     Past Medical History:  Diagnosis Date   Arthritis    Breast cancer ([place]) 2015   LT MASTECTOMY   Cataract    Depression    Diabetes mellitus without complication ([place])    Dysfunctional uterine bleeding    History of colonic polyps    Hypertension    Hypothyroidism 07/09/2007   Personal history of chemotherapy 2015   BREAST CA   Personal history of radiation therapy 2015   BREAST CA    Review of systems:  Otherwise negative.    Physical Exam  Gen: Alert, oriented. Appears stated age.  HEENT: [place]/AT. PERRLA. Lungs: CTA, no wheezes. CV: RR nl S1, S2. Abd: soft, benign, no masses. BS+ Ext: No edema. Pulses 2+  Planned procedures: Proceed with colonoscopy. The patient understands the nature of the planned procedure, indications, risks, alternatives and potential complications including but not limited to bleeding, infection, perforation, damage to internal organs and possible oversedation/side effects from anesthesia. The patient agrees and gives consent to proceed.  Please refer to procedure notes for findings, recommendations and patient disposition/instructions.     Zyere Jiminez K. Alice Reichert, M.D. Gastroenterology 06/28/2020  8:28 AM

## 2020-06-28 NOTE — Anesthesia Postprocedure Evaluation (Signed)
Anesthesia Post Note  Patient: EMI LYMON  Procedure(s) Performed: COLONOSCOPY WITH PROPOFOL  Patient location during evaluation: Endoscopy Anesthesia Type: General Level of consciousness: awake and alert and oriented Pain management: pain level controlled Vital Signs Assessment: post-procedure vital signs reviewed and stable Respiratory status: spontaneous breathing, nonlabored ventilation and respiratory function stable Cardiovascular status: blood pressure returned to baseline and stable Postop Assessment: no signs of nausea or vomiting Anesthetic complications: no   No notable events documented.   Last Vitals:  Vitals:   06/28/20 0912 06/28/20 0922  BP:  (!) 93/57  Pulse: 69 66  Resp: (!) 6 17  Temp:    SpO2: 97% 98%    Last Pain:  Vitals:   06/28/20 0922  TempSrc:   PainSc: 0-No pain                 Leora Platt

## 2020-06-29 ENCOUNTER — Encounter: Payer: Self-pay | Admitting: Internal Medicine

## 2020-12-29 ENCOUNTER — Other Ambulatory Visit: Payer: Self-pay

## 2020-12-29 DIAGNOSIS — D649 Anemia, unspecified: Secondary | ICD-10-CM

## 2020-12-29 MED ORDER — LETROZOLE 2.5 MG PO TABS: 2.5000 mg | ORAL_TABLET | Freq: Every day | ORAL | 0 refills | Status: DC

## 2020-12-29 NOTE — Progress Notes (Signed)
Former Dance movement psychotherapist pt, called to request appt, due to having no more Letrozole. Pt scheduled for lab/MD on 1/30. Refill for letrozole sent. Enough supply sent to last until visit. Pt informed and voiced understanding.

## 2021-02-12 ENCOUNTER — Inpatient Hospital Stay: Payer: Medicare PPO | Admitting: Oncology

## 2021-02-12 ENCOUNTER — Inpatient Hospital Stay: Payer: Medicare PPO | Attending: Oncology

## 2021-02-12 ENCOUNTER — Other Ambulatory Visit: Payer: Self-pay

## 2021-02-12 ENCOUNTER — Encounter: Payer: Self-pay | Admitting: Oncology

## 2021-02-12 VITALS — BP 131/78 | HR 72 | Temp 97.6°F | Resp 18 | Wt 219.0 lb

## 2021-02-12 DIAGNOSIS — Z79811 Long term (current) use of aromatase inhibitors: Secondary | ICD-10-CM | POA: Diagnosis not present

## 2021-02-12 DIAGNOSIS — N6314 Unspecified lump in the right breast, lower inner quadrant: Secondary | ICD-10-CM | POA: Diagnosis not present

## 2021-02-12 DIAGNOSIS — Z170 Estrogen receptor positive status [ER+]: Secondary | ICD-10-CM | POA: Diagnosis not present

## 2021-02-12 DIAGNOSIS — C50912 Malignant neoplasm of unspecified site of left female breast: Secondary | ICD-10-CM | POA: Diagnosis present

## 2021-02-12 DIAGNOSIS — M8580 Other specified disorders of bone density and structure, unspecified site: Secondary | ICD-10-CM | POA: Diagnosis not present

## 2021-02-12 DIAGNOSIS — M85852 Other specified disorders of bone density and structure, left thigh: Secondary | ICD-10-CM | POA: Diagnosis not present

## 2021-02-12 DIAGNOSIS — D649 Anemia, unspecified: Secondary | ICD-10-CM

## 2021-02-12 DIAGNOSIS — C773 Secondary and unspecified malignant neoplasm of axilla and upper limb lymph nodes: Secondary | ICD-10-CM | POA: Insufficient documentation

## 2021-02-12 DIAGNOSIS — Z79899 Other long term (current) drug therapy: Secondary | ICD-10-CM | POA: Diagnosis not present

## 2021-02-12 LAB — COMPREHENSIVE METABOLIC PANEL
ALT: 29 U/L (ref 0–44)
AST: 37 U/L (ref 15–41)
Albumin: 3.9 g/dL (ref 3.5–5.0)
Alkaline Phosphatase: 102 U/L (ref 38–126)
Anion gap: 12 (ref 5–15)
BUN: 12 mg/dL (ref 8–23)
CO2: 25 mmol/L (ref 22–32)
Calcium: 9.1 mg/dL (ref 8.9–10.3)
Chloride: 97 mmol/L — ABNORMAL LOW (ref 98–111)
Creatinine, Ser: 0.97 mg/dL (ref 0.44–1.00)
GFR, Estimated: >60 mL/min (ref >60)
Glucose, Bld: 224 mg/dL — ABNORMAL HIGH (ref 70–99)
Potassium: 3.6 mmol/L (ref 3.5–5.1)
Sodium: 134 mmol/L — ABNORMAL LOW (ref 135–145)
Total Bilirubin: 0.4 mg/dL (ref 0.3–1.2)
Total Protein: 7.5 g/dL (ref 6.5–8.1)

## 2021-02-12 LAB — CBC WITH DIFFERENTIAL/PLATELET
Abs Immature Granulocytes: 0.05 10*3/uL (ref 0.00–0.07)
Basophils Absolute: 0.1 10*3/uL (ref 0.0–0.1)
Basophils Relative: 1 %
Eosinophils Absolute: 0.1 10*3/uL (ref 0.0–0.5)
Eosinophils Relative: 1 %
HCT: 39.5 % (ref 36.0–46.0)
Hemoglobin: 12.8 g/dL (ref 12.0–15.0)
Immature Granulocytes: 1 %
Lymphocytes Relative: 17 %
Lymphs Abs: 1.3 10*3/uL (ref 0.7–4.0)
MCH: 28.6 pg (ref 26.0–34.0)
MCHC: 32.4 g/dL (ref 30.0–36.0)
MCV: 88.2 fL (ref 80.0–100.0)
Monocytes Absolute: 0.4 10*3/uL (ref 0.1–1.0)
Monocytes Relative: 6 %
Neutro Abs: 6 10*3/uL (ref 1.7–7.7)
Neutrophils Relative %: 74 %
Platelets: 353 10*3/uL (ref 150–400)
RBC: 4.48 MIL/uL (ref 3.87–5.11)
RDW: 13.9 % (ref 11.5–15.5)
WBC: 8 10*3/uL (ref 4.0–10.5)
nRBC: 0 % (ref 0.0–0.2)

## 2021-02-12 NOTE — Progress Notes (Signed)
Patient here for follow up. Pt needing refill on letrozole.

## 2021-02-12 NOTE — Progress Notes (Signed)
Hematology/Oncology Progress note [phone] [phone]     Office Visit:  10/04/2019  Referring physician: Maryland Pink, MD  Chief Complaint: Mary Brady is a 71 y.o. female presents for follow up of  stage IIB left breast cancer   HPI:  Patient previously followed up by Dr.Corcoran, patient switched care to me on 02/12/21 Extensive medical record review was performed by me  clinical stage IIB Her2/neu + left breast cancer s/p mastectomy and axillary lymph node dissection after neoadjuvant chemotherapy.   04/07/2013 mammogram revealed a 4 cm irregular mass located within the subareolar portion of the left breast with nipple retraction and thickening of the skin of the nipple. There were at least 3 abnormal appearing level I left axillary lymph nodes.  04/13/2013 Ultrasound guided breast biopsy revealed grade III invasive mammary carcinoma with no special type. Lymphovascular invasion was present.  Left axillary lymph node biopsy revealed metastatic carcinoma. Tumor was ER positive (1-5%), PR positive (1%) and HER-2/neu positive by FISH.  Clinical stage was T2N1M0.   05/07/2013 PET scan revealed no evidence of metastatic disease.     She enrolled on NSABP B-52 and randomized to carboplatin, Taxotere, Herceptin and Perjeta. She received 6 cycles of therapy from 05/10/2013 - 08/23/2013. She began maintenance Herceptin on 09/13/2013 and completed a year of therapy in 04/2014.  Left modified radical mastectomy and axillary lymph node dissection on 10/05/2013 revealed residual focal grade III invasive carcinoma, with microscopic foci, with lymphovascular invasion.  Twelve lymph nodes were negative for malignancy.  Pathologic stage was ypT1a(m) ypN0 Per note she began radiation therapy in 11/2013. She was complicated by cellulitis of the left chest wall with staph aureus. February 2016 patient takes aromatase inhibitor-letrozole for adjuvant endocrine therapy [facility]CI  testing on 08/27/2018 revealed a 14.5% risk of late recurrence (95% CI:  8-20.4%) and a benefit of extended endocrine therapy.  Patient has osteopenia. Today patient reports feeling well.  She takes calcium pills.  Denies any breast concerns today. Patient reports that she is growing more hair.   Past Medical History:  Diagnosis Date   Arthritis    Breast cancer ([place]) 2015   LT MASTECTOMY   Cataract    Depression    Diabetes mellitus without complication ([place])    Dysfunctional uterine bleeding    History of colonic polyps    Hypertension    Hypothyroidism 07/09/2007   Personal history of chemotherapy 2015   BREAST CA   Personal history of radiation therapy 2015   BREAST CA    Past Surgical History:  Procedure Laterality Date   BREAST BIOPSY Left 03/2013   positive   BREAST BIOPSY Right 10/08/2017   rt stereo x clip- LUMINAL AND STROMAL CALCIFICATIONS ASSOCIATED WITH MICROCYSTS AND    CATARACT EXTRACTION Bilateral 02/08/2013   CATARACT EXTRACTION W/PHACO Left 01/20/2018   Procedure: CATARACT EXTRACTION PHACO AND INTRAOCULAR LENS PLACEMENT ([place])  LEFT DIABETIC;  Surgeon: Leandrew Koyanagi, MD;  Location: [facility];  Service: Ophthalmology;  Laterality: Left;  Diabetic   COLONOSCOPY     COLONOSCOPY WITH PROPOFOL N/A 12/01/2014   Procedure: COLONOSCOPY WITH PROPOFOL;  Surgeon: Manya Silvas, MD;  Location[facility] ENDOSCOPY;  Service: Endoscopy;  Laterality: N/A;   COLONOSCOPY WITH PROPOFOL N/A 06/28/2020   Procedure: COLONOSCOPY WITH PROPOFOL;  Surgeon: [place], Benay Pike, MD;  Location: [facility] ENDOSCOPY;  Service: Gastroenterology;  Laterality: N/A;   ENDOMETRIAL BIOPSY     MASTECTOMY MODIFIED RADICAL Left 10/05/2013   BREAST CA   SKIN  BIOPSY      Family History  Problem Relation Age of Onset   Cancer Other    Cancer Cousin    Breast cancer Cousin     Social History:  reports that she has never smoked. She has never used smokeless tobacco. She reports that she  does not drink alcohol and does not use drugs. She is a retired Sales executive.  She lives in [place]. The patient is alone today.   Allergies:  Allergies  Allergen Reactions   Demerol [Meperidine] Other (See Comments)    "Hyper"   Penicillins Rash    Current Medications: Current Outpatient Medications  Medication Sig Dispense Refill   ACCU-CHEK AVIVA PLUS test strip USE 2 (TWO) TIMES DAILY     acetaminophen (TYLENOL) 500 MG tablet Take 1,000 mg by mouth every 6 (six) hours as needed (for pain).     bisoprolol-hydrochlorothiazide (ZIAC) 5-6.25 MG per tablet Take 1 tablet by mouth at bedtime.     Calcium Carb-Cholecalciferol 500-400 MG-UNIT TABS Take 1 tablet by mouth 2 (two) times daily.     cetirizine (ZYRTEC) 10 MG tablet Take 10 mg by mouth daily.      cyanocobalamin 1000 MCG tablet Take 1,000 mcg by mouth daily.     ferrous sulfate 325 (65 FE) MG tablet Take 325 mg by mouth daily with breakfast.     FLUoxetine (PROZAC) 20 MG tablet Take 20 mg by mouth daily.     glimepiride (AMARYL) 4 MG tablet Take 4 mg by mouth 2 (two) times daily.     KLOR-CON M20 20 MEQ tablet TAKE 1 TABLET BY MOUTH ONCE A DAY 30 tablet 6   Lancets MISC Use 1 each 2 (two) times daily     letrozole (FEMARA) 2.5 MG tablet Take 1 tablet (2.5 mg total) by mouth daily. 45 tablet 0   levothyroxine (SYNTHROID) 200 MCG tablet Take 200 mcg by mouth daily before breakfast.      magnesium chloride (SLOW-MAG) 64 MG TBEC SR tablet Take 1 tablet by mouth 2 (two) times daily.     pioglitazone (ACTOS) 30 MG tablet Take 30 mg by mouth daily.      simvastatin (ZOCOR) 20 MG tablet Take 20 mg by mouth at bedtime.     TRULICITY 1.5 BS/9.6GE SOPN INJECT 1.5 MG SUBCUTANEOUSLY EVERY 7 (SEVEN) DAYS     meloxicam (MOBIC) 15 MG tablet Take 15 mg by mouth as needed.  (Patient not taking: Reported on 02/12/2021)     No current facility-administered medications for this visit.    Review of Systems  Constitutional:  Negative for  chills, diaphoresis, fever, malaise/fatigue and weight loss.  HENT: Negative.  Negative for congestion, ear pain, nosebleeds, sinus pain and sore throat.   Eyes: Negative.  Negative for double vision, photophobia and pain.       S/p left cataract surgery (vision improved).  Respiratory: Negative.  Negative for cough, hemoptysis, sputum production and shortness of breath.   Cardiovascular:  Negative for chest pain, palpitations, orthopnea, leg swelling and PND.  Gastrointestinal: Negative.  Negative for abdominal pain, blood in stool, constipation, diarrhea, melena, nausea and vomiting.  Genitourinary: Negative.  Negative for dysuria, frequency, hematuria and urgency.  Musculoskeletal:  Positive for joint pain (bilateral knee). Negative for back pain, falls, myalgias and neck pain.  Skin: Negative.  Negative for itching and rash.  Neurological: Negative.  Negative for dizziness, tremors, speech change, focal weakness, weakness and headaches.  Endo/Heme/Allergies:  Does not bruise/bleed  easily.       Diabetes.    Psychiatric/Behavioral: Negative.  Negative for depression and memory loss. The patient is not nervous/anxious and does not have insomnia.   All other systems reviewed and are negative. Performance status (ECOG): 0   Blood pressure 126/80, pulse 71, temperature (!) 97.2 F (36.2 C), temperature source Tympanic, weight 230 lb 11.4 oz (104.6 kg), SpO2 98 %.   Physical Exam Nursing note reviewed.  Constitutional:      General: She is not in acute distress.    Appearance: Normal appearance. She is well-developed. She is not diaphoretic.     Interventions: Face mask in place.  HENT:     Head: Normocephalic and atraumatic.     Mouth/Throat:     Mouth: No oral lesions.  Eyes:     General: No scleral icterus.    Conjunctiva/sclera: Conjunctivae normal.     Pupils: Pupils are equal, round, and reactive to light.  Cardiovascular:     Rate and Rhythm: Normal rate and regular rhythm.      Heart sounds: Normal heart sounds. No murmur heard.   No friction rub. No gallop.  Pulmonary:     Effort: Pulmonary effort is normal.     Breath sounds: Normal breath sounds. No wheezing, rhonchi or rales.  Abdominal:     General: Bowel sounds are normal.     Palpations: Abdomen is soft. There is no mass.     Tenderness: There is no abdominal tenderness.  Musculoskeletal:        General: No tenderness. Normal range of motion.     Cervical back: Normal range of motion and neck supple.  Lymphadenopathy:     Head:     Right side of head: No preauricular, posterior auricular or occipital adenopathy.     Left side of head: No preauricular, posterior auricular or occipital adenopathy.     Cervical: No cervical adenopathy.     Right cervical: No superficial cervical adenopathy.    Upper Body:     Right upper body: No supraclavicular or axillary adenopathy.     Left upper body: No supraclavicular or axillary adenopathy.     Lower Body: No right inguinal adenopathy. No left inguinal adenopathy.  Skin:    General: Skin is warm and dry.     Findings: No bruising, erythema, lesion or rash.  Neurological:     Mental Status: She is alert and oriented to person, place, and time.  Psychiatric:        Mood and Affect: Mood normal.   Breast exam is performed in seated and lying down position. Patient is status post left mastectomy.  No palpable chest wall mass.  Right breast lower inner quadrant, cystic lesions  Appointment on 02/12/2021  Component Date Value Ref Range Status   WBC 02/12/2021 8.0  4.0 - 10.5 K/uL Final   RBC 02/12/2021 4.48  3.87 - 5.11 MIL/uL Final   Hemoglobin 02/12/2021 12.8  12.0 - 15.0 g/dL Final   HCT 02/12/2021 39.5  36.0 - 46.0 % Final   MCV 02/12/2021 88.2  80.0 - 100.0 fL Final   MCH 02/12/2021 28.6  26.0 - 34.0 pg Final   MCHC 02/12/2021 32.4  30.0 - 36.0 g/dL Final   RDW 02/12/2021 13.9  11.5 - 15.5 % Final   Platelets 02/12/2021 353  150 - 400 K/uL Final    nRBC 02/12/2021 0.0  0.0 - 0.2 % Final   Neutrophils Relative % 02/12/2021 74  %  Final   Neutro Abs 02/12/2021 6.0  1.7 - 7.7 K/uL Final   Lymphocytes Relative 02/12/2021 17  % Final   Lymphs Abs 02/12/2021 1.3  0.7 - 4.0 K/uL Final   Monocytes Relative 02/12/2021 6  % Final   Monocytes Absolute 02/12/2021 0.4  0.1 - 1.0 K/uL Final   Eosinophils Relative 02/12/2021 1  % Final   Eosinophils Absolute 02/12/2021 0.1  0.0 - 0.5 K/uL Final   Basophils Relative 02/12/2021 1  % Final   Basophils Absolute 02/12/2021 0.1  0.0 - 0.1 K/uL Final   Immature Granulocytes 02/12/2021 1  % Final   Abs Immature Granulocytes 02/12/2021 0.05  0.00 - 0.07 K/uL Final   Performed at [facility], [place], [place] [address]   Sodium 02/12/2021 134 (L)  135 - 145 mmol/L Final   Potassium 02/12/2021 3.6  3.5 - 5.1 mmol/L Final   Chloride 02/12/2021 97 (L)  98 - 111 mmol/L Final   CO2 02/12/2021 25  22 - 32 mmol/L Final   Glucose, Bld 02/12/2021 224 (H)  70 - 99 mg/dL Final   Glucose reference range applies only to samples taken after fasting for at least 8 hours.   BUN 02/12/2021 12  8 - 23 mg/dL Final   Creatinine, Ser 02/12/2021 0.97  0.44 - 1.00 mg/dL Final   Calcium 02/12/2021 9.1  8.9 - 10.3 mg/dL Final   Total Protein 02/12/2021 7.5  6.5 - 8.1 g/dL Final   Albumin 02/12/2021 3.9  3.5 - 5.0 g/dL Final   AST 02/12/2021 37  15 - 41 U/L Final   ALT 02/12/2021 29  0 - 44 U/L Final   Alkaline Phosphatase 02/12/2021 102  38 - 126 U/L Final   Total Bilirubin 02/12/2021 0.4  0.3 - 1.2 mg/dL Final   GFR, Estimated 02/12/2021 >60  >60 mL/min Final   Comment: (NOTE) Calculated using the CKD-EPI Creatinine Equation (2021)    Anion gap 02/12/2021 12  5 - 15 Final   Performed at [facility], [address] [place], [place] [address]    Assessment/assessment 1. Malignant neoplasm of left breast in female, estrogen receptor positive, unspecified site of breast ([place])   2. Mass of  lower inner quadrant of right breast   3. Osteopenia of neck of left femur   4. Aromatase inhibitor use     # History of Stage IIB left breast cancer, ER positive, HER2 positive. Labs reviewed and discussed with patient Continue letrozole 2.5 mg daily.  Plan 10 years. Physical examination reveals right breast inner lower quadrant cystic lesions.  Possible fibrocystic changes.  I will obtain right breast diagnostic mammogram.    #  Osteopenia Continue calcium and vitamin D supplementation 10/31/2019 DEXA showed osteopenia, FRAX hip fracture in 10 years 15.7%.  Discussed with patient about bisphosphonate/Prolia treatments.  Recommend dental clearance.  8.   RTC in 6 months for MD assessment, labs (CBC with diff, CMP, CA 15-3 CA 27-29),  I discussed the assessment and treatment plan with the patient.  The patient was provided an opportunity to ask questions and all were answered.  The patient agreed with the plan and demonstrated an understanding of the instructions.  The patient was advised to call back if the symptoms worsen or if the condition fails to improve as anticipated.  Earlie Server, MD, PhD [facility] Hematology Oncology 02/12/2021

## 2021-02-13 LAB — CANCER ANTIGEN 15-3: CA 15-3: 21.6 U/mL (ref 0.0–25.0)

## 2021-02-13 LAB — CANCER ANTIGEN 27.29: CA 27.29: 25.4 U/mL (ref 0.0–38.6)

## 2021-02-23 ENCOUNTER — Other Ambulatory Visit: Payer: Self-pay

## 2021-02-23 ENCOUNTER — Ambulatory Visit
Admission: RE | Admit: 2021-02-23 | Discharge: 2021-02-23 | Disposition: A | Discharge status: Home / Self Care | Payer: Medicare PPO | Source: Ambulatory Visit | Attending: Oncology | Admitting: Oncology

## 2021-02-23 DIAGNOSIS — Z170 Estrogen receptor positive status [ER+]: Secondary | ICD-10-CM

## 2021-02-23 DIAGNOSIS — C50912 Malignant neoplasm of unspecified site of left female breast: Secondary | ICD-10-CM

## 2021-02-23 DIAGNOSIS — N6314 Unspecified lump in the right breast, lower inner quadrant: Secondary | ICD-10-CM

## 2021-02-24 ENCOUNTER — Other Ambulatory Visit: Payer: Self-pay | Admitting: Oncology

## 2021-08-13 ENCOUNTER — Inpatient Hospital Stay: Payer: Medicare PPO | Admitting: Oncology

## 2021-08-13 ENCOUNTER — Inpatient Hospital Stay: Payer: Medicare PPO | Attending: Oncology

## 2021-08-13 ENCOUNTER — Encounter: Payer: Self-pay | Admitting: Oncology

## 2021-08-13 VITALS — BP 111/66 | HR 75 | Temp 97.3°F | Ht 66.0 in | Wt 201.0 lb

## 2021-08-13 DIAGNOSIS — Z170 Estrogen receptor positive status [ER+]: Secondary | ICD-10-CM | POA: Insufficient documentation

## 2021-08-13 DIAGNOSIS — Z79811 Long term (current) use of aromatase inhibitors: Secondary | ICD-10-CM | POA: Diagnosis not present

## 2021-08-13 DIAGNOSIS — C773 Secondary and unspecified malignant neoplasm of axilla and upper limb lymph nodes: Secondary | ICD-10-CM | POA: Insufficient documentation

## 2021-08-13 DIAGNOSIS — C50912 Malignant neoplasm of unspecified site of left female breast: Secondary | ICD-10-CM | POA: Diagnosis present

## 2021-08-13 DIAGNOSIS — M8580 Other specified disorders of bone density and structure, unspecified site: Secondary | ICD-10-CM

## 2021-08-13 LAB — CBC WITH DIFFERENTIAL/PLATELET
Abs Immature Granulocytes: 0.03 10*3/uL (ref 0.00–0.07)
Basophils Absolute: 0.1 10*3/uL (ref 0.0–0.1)
Basophils Relative: 1 %
Eosinophils Absolute: 0.1 10*3/uL (ref 0.0–0.5)
Eosinophils Relative: 1 %
HCT: 38.2 % (ref 36.0–46.0)
Hemoglobin: 12.5 g/dL (ref 12.0–15.0)
Immature Granulocytes: 0 %
Lymphocytes Relative: 14 %
Lymphs Abs: 1.1 10*3/uL (ref 0.7–4.0)
MCH: 29.1 pg (ref 26.0–34.0)
MCHC: 32.7 g/dL (ref 30.0–36.0)
MCV: 88.8 fL (ref 80.0–100.0)
Monocytes Absolute: 0.5 10*3/uL (ref 0.1–1.0)
Monocytes Relative: 6 %
Neutro Abs: 6.1 10*3/uL (ref 1.7–7.7)
Neutrophils Relative %: 78 %
Platelets: 388 10*3/uL (ref 150–400)
RBC: 4.3 MIL/uL (ref 3.87–5.11)
RDW: 13.8 % (ref 11.5–15.5)
WBC: 7.9 10*3/uL (ref 4.0–10.5)
nRBC: 0 % (ref 0.0–0.2)

## 2021-08-13 LAB — COMPREHENSIVE METABOLIC PANEL
ALT: 37 U/L (ref 0–44)
AST: 42 U/L — ABNORMAL HIGH (ref 15–41)
Albumin: 4.2 g/dL (ref 3.5–5.0)
Alkaline Phosphatase: 112 U/L (ref 38–126)
Anion gap: 10 (ref 5–15)
BUN: 21 mg/dL (ref 8–23)
CO2: 24 mmol/L (ref 22–32)
Calcium: 9.1 mg/dL (ref 8.9–10.3)
Chloride: 100 mmol/L (ref 98–111)
Creatinine, Ser: 0.84 mg/dL (ref 0.44–1.00)
GFR, Estimated: >60 mL/min (ref >60)
Glucose, Bld: 202 mg/dL — ABNORMAL HIGH (ref 70–99)
Potassium: 3.8 mmol/L (ref 3.5–5.1)
Sodium: 134 mmol/L — ABNORMAL LOW (ref 135–145)
Total Bilirubin: 0.6 mg/dL (ref 0.3–1.2)
Total Protein: 7.7 g/dL (ref 6.5–8.1)

## 2021-08-13 MED ORDER — LETROZOLE 2.5 MG PO TABS: 2.5000 mg | ORAL_TABLET | Freq: Every day | ORAL | 1 refills | Status: DC

## 2021-08-13 NOTE — Progress Notes (Signed)
Hematology/Oncology Progress note [phone] [phone]        Office Visit:  10/04/2019  Referring physician: Maryland Pink, MD  Chief Complaint: Mary Brady is a 71 y.o. female presents for follow up of  stage IIB left breast cancer   HPI:  Patient previously followed up by Dr.Corcoran, patient switched care to me on 02/12/21 Extensive medical record review was performed by me  clinical stage IIB Her2/neu + left breast cancer s/p mastectomy and axillary lymph node dissection after neoadjuvant chemotherapy.   04/07/2013 mammogram revealed a 4 cm irregular mass located within the subareolar portion of the left breast with nipple retraction and thickening of the skin of the nipple. There were at least 3 abnormal appearing level I left axillary lymph nodes.  04/13/2013 Ultrasound guided breast biopsy revealed grade III invasive mammary carcinoma with no special type. Lymphovascular invasion was present.  Left axillary lymph node biopsy revealed metastatic carcinoma. Tumor was ER positive (1-5%), PR positive (1%) and HER-2/neu positive by FISH.  Clinical stage was T2N1M0.   05/07/2013 PET scan revealed no evidence of metastatic disease.     She enrolled on NSABP B-52 and randomized to carboplatin, Taxotere, Herceptin and Perjeta. She received 6 cycles of therapy from 05/10/2013 - 08/23/2013. She began maintenance Herceptin on 09/13/2013 and completed a year of therapy in 04/2014.  Left modified radical mastectomy and axillary lymph node dissection on 10/05/2013 revealed residual focal grade III invasive carcinoma, with microscopic foci, with lymphovascular invasion.  Twelve lymph nodes were negative for malignancy.  Pathologic stage was ypT1a(m) ypN0 Per note she began radiation therapy in 11/2013. She was complicated by cellulitis of the left chest wall with staph aureus. February 2016 patient takes aromatase inhibitor-letrozole for adjuvant endocrine therapy [facility]  testing on 08/27/2018 revealed a 14.5% risk of late recurrence (95% CI:  8-20.4%) and a benefit of extended endocrine therapy.  INTERVAL HISTORY Mary Brady is a 71 y.o. female who has above history reviewed by me today presents for follow up visit for breast cancer  Patient has osteopenia. She takes calcium and supplementation.  She feels well. Tolerates letrozole. She take calcium supplementation.    Past Medical History:  Diagnosis Date   Arthritis    Breast cancer ([place]) 2015   LT MASTECTOMY   Cataract    Depression    Diabetes mellitus without complication ([place])    Dysfunctional uterine bleeding    History of colonic polyps    Hypertension    Hypothyroidism 07/09/2007   Personal history of chemotherapy 2015   BREAST CA   Personal history of radiation therapy 2015   BREAST CA    Past Surgical History:  Procedure Laterality Date   BREAST BIOPSY Left 03/2013   positive   BREAST BIOPSY Right 10/08/2017   rt stereo x clip- LUMINAL AND STROMAL CALCIFICATIONS ASSOCIATED WITH MICROCYSTS AND    CATARACT EXTRACTION Bilateral 02/08/2013   CATARACT EXTRACTION W/PHACO Left 01/20/2018   Procedure: CATARACT EXTRACTION PHACO AND INTRAOCULAR LENS PLACEMENT ([place])  LEFT DIABETIC;  Surgeon: Leandrew Koyanagi, MD;  Location: [facility];  Service: Ophthalmology;  Laterality: Left;  Diabetic   COLONOSCOPY     COLONOSCOPY WITH PROPOFOL N/A 12/01/2014   Procedure: COLONOSCOPY WITH PROPOFOL;  Surgeon: Manya Silvas, MD;  Location: [facility] ENDOSCOPY;  Service: Endoscopy;  Laterality: N/A;   COLONOSCOPY WITH PROPOFOL N/A 06/28/2020   Procedure: COLONOSCOPY WITH PROPOFOL;  Surgeon: Toledo, Benay Pike, MD;  Location: [facility] ENDOSCOPY;  Service: Gastroenterology;  Laterality: N/A;   ENDOMETRIAL BIOPSY     MASTECTOMY MODIFIED RADICAL Left 10/05/2013   BREAST CA   SKIN BIOPSY      Family History  Problem Relation Age of Onset   Cancer Other    Cancer Cousin    Breast cancer Cousin      Social History:  reports that she has never smoked. She has never used smokeless tobacco. She reports that she does not drink alcohol and does not use drugs. She is a retired Sales executive.  She lives in [place]. The patient is alone today.   Allergies:  Allergies  Allergen Reactions   Demerol [Meperidine] Other (See Comments)    "Hyper"   Penicillins Rash    Current Medications: Current Outpatient Medications  Medication Sig Dispense Refill   ACCU-CHEK AVIVA PLUS test strip USE 2 (TWO) TIMES DAILY     acetaminophen (TYLENOL) 500 MG tablet Take 1,000 mg by mouth every 6 (six) hours as needed (for pain).     bisoprolol-hydrochlorothiazide (ZIAC) 5-6.25 MG per tablet Take 1 tablet by mouth at bedtime.     Calcium Carb-Cholecalciferol 500-400 MG-UNIT TABS Take 1 tablet by mouth 2 (two) times daily.     cetirizine (ZYRTEC) 10 MG tablet Take 10 mg by mouth daily.      ferrous sulfate 325 (65 FE) MG tablet Take 325 mg by mouth daily with breakfast.     FLUoxetine (PROZAC) 20 MG tablet Take 20 mg by mouth daily.     glimepiride (AMARYL) 4 MG tablet Take 4 mg by mouth 2 (two) times daily.     KLOR-CON M20 20 MEQ tablet TAKE 1 TABLET BY MOUTH ONCE A DAY 30 tablet 6   Lancets MISC Use 1 each 2 (two) times daily     levothyroxine (SYNTHROID) 200 MCG tablet TAKE 1 TABLET(200 MCG) BY MOUTH EVERY DAY 30 TO 60 MINUTES BEFORE BREAKFAST ON AN EMPTY STOMACH AND WITH A GLASS OF WATER     magnesium chloride (SLOW-MAG) 64 MG TBEC SR tablet Take 1 tablet by mouth 2 (two) times daily.     pioglitazone (ACTOS) 30 MG tablet Take 30 mg by mouth daily.      simvastatin (ZOCOR) 20 MG tablet Take 20 mg by mouth at bedtime.     TRULICITY 1.5 AS/5.0NL SOPN INJECT 1.5 MG SUBCUTANEOUSLY EVERY 7 (SEVEN) DAYS     cyanocobalamin 1000 MCG tablet Take 1,000 mcg by mouth daily.     letrozole (FEMARA) 2.5 MG tablet Take 1 tablet (2.5 mg total) by mouth daily. 90 tablet 1   levothyroxine (SYNTHROID) 200 MCG  tablet Take 200 mcg by mouth daily before breakfast.      meloxicam (MOBIC) 15 MG tablet Take 15 mg by mouth as needed.  (Patient not taking: Reported on 02/12/2021)     No current facility-administered medications for this visit.    Review of Systems  Constitutional:  Negative for chills, diaphoresis, fever, malaise/fatigue and weight loss.  HENT: Negative.  Negative for congestion, ear pain, nosebleeds, sinus pain and sore throat.   Eyes: Negative.  Negative for double vision, photophobia and pain.       S/p left cataract surgery (vision improved).  Respiratory: Negative.  Negative for cough, hemoptysis, sputum production and shortness of breath.   Cardiovascular:  Negative for chest pain, palpitations, orthopnea, leg swelling and PND.  Gastrointestinal: Negative.  Negative for abdominal pain, blood in stool, constipation, diarrhea, melena, nausea and vomiting.  Genitourinary: Negative.  Negative for dysuria, frequency, hematuria and urgency.  Musculoskeletal:  Positive for joint pain (bilateral knee). Negative for back pain, falls, myalgias and neck pain.  Skin: Negative.  Negative for itching and rash.  Neurological: Negative.  Negative for dizziness, tremors, speech change, focal weakness, weakness and headaches.  Endo/Heme/Allergies:  Does not bruise/bleed easily.       Diabetes.    Psychiatric/Behavioral: Negative.  Negative for depression and memory loss. The patient is not nervous/anxious and does not have insomnia.   All other systems reviewed and are negative.  Performance status (ECOG): 0   Blood pressure 126/80, pulse 71, temperature (!) 97.2 F (36.2 C), temperature source Tympanic, weight 230 lb 11.4 oz (104.6 kg), SpO2 98 %.   Physical Exam Nursing note reviewed.  Constitutional:      General: She is not in acute distress.    Appearance: Normal appearance. She is well-developed. She is not diaphoretic.     Interventions: Face mask in place.  HENT:     Head:  Normocephalic and atraumatic.     Mouth/Throat:     Mouth: No oral lesions.  Eyes:     General: No scleral icterus.    Conjunctiva/sclera: Conjunctivae normal.     Pupils: Pupils are equal, round, and reactive to light.  Cardiovascular:     Rate and Rhythm: Normal rate and regular rhythm.     Heart sounds: Normal heart sounds. No murmur heard.    No friction rub. No gallop.  Pulmonary:     Effort: Pulmonary effort is normal.     Breath sounds: Normal breath sounds. No wheezing, rhonchi or rales.  Abdominal:     General: Bowel sounds are normal.     Palpations: Abdomen is soft. There is no mass.     Tenderness: There is no abdominal tenderness.  Musculoskeletal:        General: No tenderness. Normal range of motion.     Cervical back: Normal range of motion and neck supple.  Lymphadenopathy:     Head:     Right side of head: No preauricular, posterior auricular or occipital adenopathy.     Left side of head: No preauricular, posterior auricular or occipital adenopathy.     Cervical: No cervical adenopathy.     Right cervical: No superficial cervical adenopathy.    Upper Body:     Right upper body: No supraclavicular or axillary adenopathy.     Left upper body: No supraclavicular or axillary adenopathy.     Lower Body: No right inguinal adenopathy. No left inguinal adenopathy.  Skin:    General: Skin is warm and dry.     Findings: No bruising, erythema, lesion or rash.  Neurological:     Mental Status: She is alert and oriented to person, place, and time.  Psychiatric:        Mood and Affect: Mood normal.   Breast exam is performed in seated and lying down position. Patient is status post left mastectomy.  No palpable chest wall mass.  Right breast lower inner quadrant, cystic lesions   Labs.    Latest Ref Rng & Units 08/13/2021    2:07 PM 02/12/2021    2:08 PM 10/04/2019    2:56 PM  CBC  WBC 4.0 - 10.5 K/uL 7.9  8.0  6.6   Hemoglobin 12.0 - 15.0 g/dL 12.5  12.8  11.3    Hematocrit 36.0 - 46.0 % 38.2  39.5  33.5   Platelets 150 - 400 K/uL  388  353  370       Latest Ref Rng & Units 08/13/2021    2:07 PM 02/12/2021    2:08 PM 10/04/2019    2:56 PM  CMP  Glucose 70 - 99 mg/dL 202  224  155   BUN 8 - 23 mg/dL '21  12  17   ' Creatinine 0.44 - 1.00 mg/dL 0.84  0.97  0.92   Sodium 135 - 145 mmol/L 134  134  132   Potassium 3.5 - 5.1 mmol/L 3.8  3.6  4.0   Chloride 98 - 111 mmol/L 100  97  101   CO2 22 - 32 mmol/L '24  25  24   ' Calcium 8.9 - 10.3 mg/dL 9.1  9.1  8.5   Total Protein 6.5 - 8.1 g/dL 7.7  7.5  7.2   Total Bilirubin 0.3 - 1.2 mg/dL 0.6  0.4  0.4   Alkaline Phos 38 - 126 U/L 112  102  86   AST 15 - 41 U/L 42  37  24   ALT 0 - 44 U/L 37  29  20    RADIOGRAPHIC STUDIES: I have personally reviewed the radiological images as listed and agreed with the findings in the report. No results found.   Assessment/assessment 1. Malignant neoplasm of left breast in female, estrogen receptor positive, unspecified site of breast ([place])   2. Aromatase inhibitor use   3. Osteopenia, unspecified location     Cancer Staging  Cancer of left breast [facility]) Staging form: Breast, AJCC 7th Edition - Clinical stage from 05/07/2013: Stage IIB (T2, N1, M0) - Signed by Lequita Asal, MD on 08/08/2016   # History of Stage IIB left breast cancer, ER positive, HER2 positive. Labs are reviewed and discussed with patient. Refilled letrozole 2.5 mg daily.  Plan 10 years- till Feb 2016 02/23/21 diagnostic right mammogram /ultrasound showed no malignancy.  Repeat mammogram in Feb 2024  #  Osteopenia Continue calcium and vitamin D supplementation 10/31/2019 DEXA showed osteopenia, FRAX hip fracture in 10 years 15.7%.  Discussed with patient about bisphosphonate/Prolia treatments.  Recommend dental clearance.   RTC in 6 months for MD assessment, labs (CBC with diff, CMP, CA 15-3 CA 27-29),  I discussed the assessment and treatment plan with the patient.  The patient was  provided an opportunity to ask questions and all were answered.  The patient agreed with the plan and demonstrated an understanding of the instructions.  The patient was advised to call back if the symptoms worsen or if the condition fails to improve as anticipated.  Earlie Server, MD, PhD [facility] Hematology Oncology 08/13/2021

## 2021-08-14 ENCOUNTER — Telehealth: Payer: Self-pay

## 2021-08-14 NOTE — Telephone Encounter (Signed)
Faxed request for prosthetic Bra to [facility] along with last encounter note, insurance info, and demographics.

## 2021-11-05 ENCOUNTER — Other Ambulatory Visit: Payer: Self-pay | Admitting: Oncology

## 2021-11-05 ENCOUNTER — Other Ambulatory Visit: Payer: Self-pay

## 2021-11-05 DIAGNOSIS — C50912 Malignant neoplasm of unspecified site of left female breast: Secondary | ICD-10-CM

## 2021-11-06 ENCOUNTER — Telehealth: Payer: Self-pay

## 2021-11-06 NOTE — Telephone Encounter (Signed)
Please add Prolia to next visit in January

## 2021-11-06 NOTE — Telephone Encounter (Signed)
-----   Message from Earlie Server, MD sent at 11/05/2021  9:05 PM EDT ----- Regarding: RE: [place] Thanks. Please schedule her to get Prolia at next visit.  ----- Message ----- From: Evelina Dun, RN Sent: 11/05/2021   3:49 PM EDT To: Earlie Server, MD Subject: RE: [place]  Please see dental clearance and advise if pt needs anything scheduled.  ----- Message ----- From: Secundino Ginger Sent: 11/02/2021   2:50 PM EDT To: Evelina Dun, RN Subject: DENTAL CLEARANCE- [place] sent to her chart.

## 2022-02-13 ENCOUNTER — Inpatient Hospital Stay: Payer: Medicare PPO | Attending: Oncology

## 2022-02-13 ENCOUNTER — Encounter: Payer: Self-pay | Admitting: Oncology

## 2022-02-13 ENCOUNTER — Inpatient Hospital Stay: Payer: Medicare PPO | Admitting: Oncology

## 2022-02-13 ENCOUNTER — Inpatient Hospital Stay: Payer: Medicare PPO

## 2022-02-13 VITALS — BP 138/85 | HR 80 | Temp 96.9°F | Resp 18 | Wt 211.4 lb

## 2022-02-13 DIAGNOSIS — M8580 Other specified disorders of bone density and structure, unspecified site: Secondary | ICD-10-CM

## 2022-02-13 DIAGNOSIS — C773 Secondary and unspecified malignant neoplasm of axilla and upper limb lymph nodes: Secondary | ICD-10-CM | POA: Diagnosis not present

## 2022-02-13 DIAGNOSIS — C50112 Malignant neoplasm of central portion of left female breast: Secondary | ICD-10-CM | POA: Diagnosis not present

## 2022-02-13 DIAGNOSIS — Z170 Estrogen receptor positive status [ER+]: Secondary | ICD-10-CM

## 2022-02-13 DIAGNOSIS — C50912 Malignant neoplasm of unspecified site of left female breast: Secondary | ICD-10-CM

## 2022-02-13 DIAGNOSIS — Z79811 Long term (current) use of aromatase inhibitors: Secondary | ICD-10-CM | POA: Diagnosis not present

## 2022-02-13 DIAGNOSIS — M85851 Other specified disorders of bone density and structure, right thigh: Secondary | ICD-10-CM

## 2022-02-13 LAB — COMPREHENSIVE METABOLIC PANEL
ALT: 19 U/L (ref 0–44)
AST: 24 U/L (ref 15–41)
Albumin: 3.9 g/dL (ref 3.5–5.0)
Alkaline Phosphatase: 93 U/L (ref 38–126)
Anion gap: 9 (ref 5–15)
BUN: 20 mg/dL (ref 8–23)
CO2: 23 mmol/L (ref 22–32)
Calcium: 8.8 mg/dL — ABNORMAL LOW (ref 8.9–10.3)
Chloride: 103 mmol/L (ref 98–111)
Creatinine, Ser: 0.91 mg/dL (ref 0.44–1.00)
GFR, Estimated: >60 mL/min (ref >60)
Glucose, Bld: 169 mg/dL — ABNORMAL HIGH (ref 70–99)
Potassium: 3.6 mmol/L (ref 3.5–5.1)
Sodium: 135 mmol/L (ref 135–145)
Total Bilirubin: 0.3 mg/dL (ref 0.3–1.2)
Total Protein: 7.5 g/dL (ref 6.5–8.1)

## 2022-02-13 LAB — CBC WITH DIFFERENTIAL/PLATELET
Abs Immature Granulocytes: 0.01 10*3/uL (ref 0.00–0.07)
Basophils Absolute: 0.1 10*3/uL (ref 0.0–0.1)
Basophils Relative: 1 %
Eosinophils Absolute: 0.1 10*3/uL (ref 0.0–0.5)
Eosinophils Relative: 2 %
HCT: 39.7 % (ref 36.0–46.0)
Hemoglobin: 12.8 g/dL (ref 12.0–15.0)
Immature Granulocytes: 0 %
Lymphocytes Relative: 24 %
Lymphs Abs: 1.5 10*3/uL (ref 0.7–4.0)
MCH: 28 pg (ref 26.0–34.0)
MCHC: 32.2 g/dL (ref 30.0–36.0)
MCV: 86.9 fL (ref 80.0–100.0)
Monocytes Absolute: 0.4 10*3/uL (ref 0.1–1.0)
Monocytes Relative: 6 %
Neutro Abs: 4.2 10*3/uL (ref 1.7–7.7)
Neutrophils Relative %: 67 %
Platelets: 349 10*3/uL (ref 150–400)
RBC: 4.57 MIL/uL (ref 3.87–5.11)
RDW: 14.5 % (ref 11.5–15.5)
WBC: 6.2 10*3/uL (ref 4.0–10.5)
nRBC: 0 % (ref 0.0–0.2)

## 2022-02-13 MED ORDER — DENOSUMAB 60 MG/ML ~~LOC~~ SOSY
60.0000 mg | PREFILLED_SYRINGE | Freq: Once | SUBCUTANEOUS | Status: AC
  Administered 2022-02-13: 60 mg via SUBCUTANEOUS
  Filled 2022-02-13: qty 1

## 2022-02-13 MED ORDER — LETROZOLE 2.5 MG PO TABS: 2.5000 mg | ORAL_TABLET | Freq: Every day | ORAL | 1 refills | Status: DC

## 2022-02-13 NOTE — Assessment & Plan Note (Signed)
History of Stage IIB left breast cancer, ER positive, HER2 positive. Labs are reviewed and discussed with patient. Refilled letrozole 2.5 mg daily.  Plan 10 years- till Feb 2026 02/23/21 diagnostic right mammogram /ultrasound showed no malignancy.  Repeat mammogram in Feb 2024

## 2022-02-13 NOTE — Assessment & Plan Note (Signed)
Continue calcium and vitamin D supplementation 10/31/2019 DEXA showed osteopenia, FRAX hip fracture in 10 years 15.7%. Proceed with prolia.  Repeat DEXA

## 2022-02-13 NOTE — Progress Notes (Signed)
Hematology/Oncology Progress note [phone] [phone]    Chief Complaint: [place] is a 72 y.o. female presents for follow up of  stage IIB left breast cancer   ASSESSMENT & PLAN:   Osteopenia Continue calcium and vitamin D supplementation 10/31/2019 DEXA showed osteopenia, FRAX hip fracture in 10 years 15.7%. Proceed with prolia.  Repeat DEXA  Cancer of left breast ([place]) History of Stage IIB left breast cancer, ER positive, HER2 positive. Labs are reviewed and discussed with patient. Refilled letrozole 2.5 mg daily.  Plan 10 years- till Feb 2026 02/23/21 diagnostic right mammogram /ultrasound showed no malignancy.  Repeat mammogram in Feb 2024   Orders Placed This Encounter  Procedures   DG Bone Density    Standing Status:   Future    Standing Expiration Date:   02/13/2023    Order Specific Question:   Reason for Exam (SYMPTOM  OR DIAGNOSIS REQUIRED)    Answer:   Breast Cancer    Order Specific Question:   Preferred imaging location?    Answer:   [facility]   CBC with Differential/Platelet    Standing Status:   Future    Standing Expiration Date:   02/13/2023   Comprehensive metabolic panel    Standing Status:   Future    Standing Expiration Date:   02/13/2023   CA 27.29 (SERIAL MONITOR)    Standing Status:   Future    Standing Expiration Date:   02/14/2023   Cancer antigen 15-3    Standing Status:   Future    Standing Expiration Date:   02/14/2023   Follow up in 6 months. All questions were answered. The patient knows to call the clinic with any problems, questions or concerns.  Earlie Server, MD, PhD [facility] Hematology Oncology 02/13/2022   HPI:  Patient previously followed up by Dr.Corcoran, patient switched care to me on 02/12/21 Extensive medical record review was performed by me  clinical stage IIB Her2/neu + left breast cancer s/p mastectomy and axillary lymph node dissection after neoadjuvant chemotherapy.   04/07/2013  mammogram revealed a 4 cm irregular mass located within the subareolar portion of the left breast with nipple retraction and thickening of the skin of the nipple. There were at least 3 abnormal appearing level I left axillary lymph nodes.  04/13/2013 Ultrasound guided breast biopsy revealed grade III invasive mammary carcinoma with no special type. Lymphovascular invasion was present.  Left axillary lymph node biopsy revealed metastatic carcinoma. Tumor was ER positive (1-5%), PR positive (1%) and HER-2/neu positive by FISH.  Clinical stage was T2N1M0.   05/07/2013 PET scan revealed no evidence of metastatic disease.     She enrolled on NSABP B-52 and randomized to carboplatin, Taxotere, Herceptin and Perjeta. She received 6 cycles of therapy from 05/10/2013 - 08/23/2013. She began maintenance Herceptin on 09/13/2013 and completed a year of therapy in 04/2014.  Left modified radical mastectomy and axillary lymph node dissection on 10/05/2013 revealed residual focal grade III invasive carcinoma, with microscopic foci, with lymphovascular invasion.  Twelve lymph nodes were negative for malignancy.  Pathologic stage was ypT1a(m) ypN0 Per note she began radiation therapy in 11/2013. She was complicated by cellulitis of the left chest wall with staph aureus. February 2016 patient takes aromatase inhibitor-letrozole for adjuvant endocrine therapy BCI testing on 08/27/2018 revealed a 14.5% risk of late recurrence (95% CI:  8-20.4%) and a benefit of extended endocrine therapy.  Mediport has been removed.  INTERVAL HISTORY SHANE[place] is a  72 y.o. female who has above history reviewed by me today presents for follow up visit for breast cancer  Patient has osteopenia. She takes calcium and supplementation.  She feels well. Tolerates letrozole. She take calcium supplementation.  Patient denies any breast complaints.   Past Medical History:  Diagnosis Date   Arthritis    Breast cancer ([place]) 2015    LT MASTECTOMY   Cataract    Depression    Diabetes mellitus without complication ([place])    Dysfunctional uterine bleeding    History of colonic polyps    Hypertension    Hypothyroidism 07/09/2007   Personal history of chemotherapy 2015   BREAST CA   Personal history of radiation therapy 2015   BREAST CA    Past Surgical History:  Procedure Laterality Date   BREAST BIOPSY Left 03/2013   positive   BREAST BIOPSY Right 10/08/2017   rt stereo x clip- LUMINAL AND STROMAL CALCIFICATIONS ASSOCIATED WITH MICROCYSTS AND    CATARACT EXTRACTION Bilateral 02/08/2013   CATARACT EXTRACTION W/PHACO Left 01/20/2018   Procedure: CATARACT EXTRACTION PHACO AND INTRAOCULAR LENS PLACEMENT ([place])  LEFT DIABETIC;  Surgeon: Leandrew Koyanagi, MD;  Location: [place];  Service: Ophthalmology;  Laterality: Left;  Diabetic   COLONOSCOPY     COLONOSCOPY WITH PROPOFOL N/A 12/01/2014   Procedure: COLONOSCOPY WITH PROPOFOL;  Surgeon: Manya Silvas, MD;  Location: [facility] ENDOSCOPY;  Service: Endoscopy;  Laterality: N/A;   COLONOSCOPY WITH PROPOFOL N/A 06/28/2020   Procedure: COLONOSCOPY WITH PROPOFOL;  Surgeon: Toledo, Benay Pike, MD;  Location: [facility] ENDOSCOPY;  Service: Gastroenterology;  Laterality: N/A;   ENDOMETRIAL BIOPSY     MASTECTOMY MODIFIED RADICAL Left 10/05/2013   BREAST CA   SKIN BIOPSY      Family History  Problem Relation Age of Onset   Cancer Other    Cancer Cousin    Breast cancer Cousin     Social History:  reports that she has never smoked. She has never used smokeless tobacco. She reports that she does not drink alcohol and does not use drugs. She is a retired Sales executive.  She lives in [place]. The patient is alone today.   Allergies:  Allergies  Allergen Reactions   Demerol [Meperidine] Other (See Comments)    "Hyper"   Penicillins Rash    Current Medications: Current Outpatient Medications  Medication Sig Dispense Refill   ACCU-CHEK AVIVA PLUS test  strip USE 2 (TWO) TIMES DAILY     acetaminophen (TYLENOL) 500 MG tablet Take 1,000 mg by mouth every 6 (six) hours as needed (for pain).     bisoprolol-hydrochlorothiazide (ZIAC) 5-6.25 MG per tablet Take 1 tablet by mouth at bedtime.     Calcium Carb-Cholecalciferol 500-400 MG-UNIT TABS Take 1 tablet by mouth 2 (two) times daily.     cetirizine (ZYRTEC) 10 MG tablet Take 10 mg by mouth daily.      ferrous sulfate 325 (65 FE) MG tablet Take 325 mg by mouth daily with breakfast.     FLUoxetine (PROZAC) 20 MG tablet Take 20 mg by mouth daily.     glimepiride (AMARYL) 4 MG tablet Take 4 mg by mouth 2 (two) times daily.     KLOR-CON M20 20 MEQ tablet TAKE 1 TABLET BY MOUTH ONCE A DAY 30 tablet 6   Lancets MISC Use 1 each 2 (two) times daily     levothyroxine (SYNTHROID) 200 MCG tablet TAKE 1 TABLET(200 MCG) BY MOUTH EVERY DAY 30 TO 60 MINUTES  BEFORE BREAKFAST ON AN EMPTY STOMACH AND WITH A GLASS OF WATER     magnesium chloride (SLOW-MAG) 64 MG TBEC SR tablet Take 1 tablet by mouth 2 (two) times daily.     pioglitazone (ACTOS) 30 MG tablet Take 30 mg by mouth daily.      simvastatin (ZOCOR) 20 MG tablet Take 20 mg by mouth at bedtime.     TRULICITY 1.5 ZL/9.3TT SOPN INJECT 1.5 MG SUBCUTANEOUSLY EVERY 7 (SEVEN) DAYS     cyanocobalamin 1000 MCG tablet Take 1,000 mcg by mouth daily. (Patient not taking: Reported on 02/13/2022)     letrozole (FEMARA) 2.5 MG tablet Take 1 tablet (2.5 mg total) by mouth daily. 90 tablet 1   meloxicam (MOBIC) 15 MG tablet Take 15 mg by mouth as needed.  (Patient not taking: Reported on 02/13/2022)     No current facility-administered medications for this visit.    Review of Systems  Constitutional:  Negative for appetite change, chills, fatigue and fever.  HENT:   Negative for hearing loss and voice change.   Eyes:  Negative for eye problems.       Status post cataract surgery.  Vision improved.  Respiratory:  Negative for chest tightness and cough.   Cardiovascular:   Negative for chest pain.  Gastrointestinal:  Negative for abdominal distention, abdominal pain and blood in stool.  Endocrine: Negative for hot flashes.  Genitourinary:  Negative for difficulty urinating and frequency.   Musculoskeletal:  Positive for arthralgias.  Skin:  Negative for itching and rash.  Neurological:  Negative for extremity weakness.  Hematological:  Negative for adenopathy.  Psychiatric/Behavioral:  Negative for confusion.     Performance status (ECOG): 0   Blood pressure 126/80, pulse 71, temperature (!) 97.2 F (36.2 C), temperature source Tympanic, weight 230 lb 11.4 oz (104.6 kg), SpO2 98 %.  Physical Exam Constitutional:      General: She is not in acute distress.    Appearance: She is not diaphoretic.  HENT:     Head: Normocephalic and atraumatic.     Nose: Nose normal.     Mouth/Throat:     Pharynx: No oropharyngeal exudate.  Eyes:     General: No scleral icterus.    Pupils: Pupils are equal, round, and reactive to light.  Cardiovascular:     Rate and Rhythm: Normal rate and regular rhythm.     Heart sounds: No murmur heard. Pulmonary:     Effort: Pulmonary effort is normal. No respiratory distress.     Breath sounds: No rales.  Chest:     Chest wall: No tenderness.  Abdominal:     General: There is no distension.     Palpations: Abdomen is soft.     Tenderness: There is no abdominal tenderness.  Musculoskeletal:        General: Normal range of motion.     Cervical back: Normal range of motion and neck supple.  Skin:    General: Skin is warm and dry.     Findings: No erythema.  Neurological:     Mental Status: She is alert and oriented to person, place, and time.     Cranial Nerves: No cranial nerve deficit.     Motor: No abnormal muscle tone.     Coordination: Coordination normal.  Psychiatric:        Mood and Affect: Affect normal.      Labs.    Latest Ref Rng & Units 02/13/2022    2:00 PM 08/13/2021  2:07 PM 02/12/2021    2:08 PM   CBC  WBC 4.0 - 10.5 K/uL 6.2  7.9  8.0   Hemoglobin 12.0 - 15.0 g/dL 12.8  12.5  12.8   Hematocrit 36.0 - 46.0 % 39.7  38.2  39.5   Platelets 150 - 400 K/uL 349  388  353       Latest Ref Rng & Units 02/13/2022    2:00 PM 08/13/2021    2:07 PM 02/12/2021    2:08 PM  CMP  Glucose 70 - 99 mg/dL 169  202  224   BUN 8 - 23 mg/dL '20  21  12   '$ Creatinine 0.44 - 1.00 mg/dL 0.91  0.84  0.97   Sodium 135 - 145 mmol/L 135  134  134   Potassium 3.5 - 5.1 mmol/L 3.6  3.8  3.6   Chloride 98 - 111 mmol/L 103  100  97   CO2 22 - 32 mmol/L '23  24  25   '$ Calcium 8.9 - 10.3 mg/dL 8.8  9.1  9.1   Total Protein 6.5 - 8.1 g/dL 7.5  7.7  7.5   Total Bilirubin 0.3 - 1.2 mg/dL 0.3  0.6  0.4   Alkaline Phos 38 - 126 U/L 93  112  102   AST 15 - 41 U/L 24  42  37   ALT 0 - 44 U/L 19  37  29    RADIOGRAPHIC STUDIES: I have personally reviewed the radiological images as listed and agreed with the findings in the report. No results found.   Assessment/assessment 1. Malignant neoplasm of left breast in female, estrogen receptor positive, unspecified site of breast [facility])     Cancer Staging  Cancer of left breast [facility]) Staging form: Breast, AJCC 7th Edition - Clinical stage from 05/07/2013: Stage IIB (T2, N1, M0) - Signed by Lequita Asal, MD on 08/08/2016   #  #  Osteopenia   Discussed with patient about bisphosphonate/Prolia treatments.  Recommend dental clearance.   RTC in 6 months for MD assessment, labs (CBC with diff, CMP, CA 15-3 CA 27-29),  I discussed the assessment and treatment plan with the patient.  The patient was provided an opportunity to ask questions and all were answered.  The patient agreed with the plan and demonstrated an understanding of the instructions.  The patient was advised to call back if the symptoms worsen or if the condition fails to improve as anticipated.  Earlie Server, MD, PhD [facility] Hematology Oncology 02/13/2022

## 2022-02-14 LAB — CANCER ANTIGEN 27.29: CA 27.29: 25.5 U/mL (ref 0.0–38.6)

## 2022-02-15 LAB — CANCER ANTIGEN 15-3: CA 15-3: 24.7 U/mL (ref 0.0–25.0)

## 2022-02-25 ENCOUNTER — Ambulatory Visit
Admission: RE | Admit: 2022-02-25 | Discharge: 2022-02-25 | Disposition: A | Discharge status: Home / Self Care | Payer: Medicare PPO | Source: Ambulatory Visit | Attending: Oncology | Admitting: Oncology

## 2022-02-25 DIAGNOSIS — Z853 Personal history of malignant neoplasm of breast: Secondary | ICD-10-CM | POA: Diagnosis not present

## 2022-02-25 DIAGNOSIS — Z9012 Acquired absence of left breast and nipple: Secondary | ICD-10-CM | POA: Insufficient documentation

## 2022-02-25 DIAGNOSIS — Z1231 Encounter for screening mammogram for malignant neoplasm of breast: Secondary | ICD-10-CM | POA: Insufficient documentation

## 2022-02-25 DIAGNOSIS — C50912 Malignant neoplasm of unspecified site of left female breast: Secondary | ICD-10-CM

## 2022-02-27 ENCOUNTER — Other Ambulatory Visit: Payer: Self-pay | Admitting: Oncology

## 2022-02-27 DIAGNOSIS — R928 Other abnormal and inconclusive findings on diagnostic imaging of breast: Secondary | ICD-10-CM

## 2022-02-28 ENCOUNTER — Ambulatory Visit
Admission: RE | Admit: 2022-02-28 | Discharge: 2022-02-28 | Disposition: A | Discharge status: Home / Self Care | Payer: Medicare PPO | Source: Ambulatory Visit | Attending: Oncology | Admitting: Oncology

## 2022-02-28 DIAGNOSIS — R928 Other abnormal and inconclusive findings on diagnostic imaging of breast: Secondary | ICD-10-CM | POA: Diagnosis present

## 2022-04-03 ENCOUNTER — Ambulatory Visit
Admission: RE | Admit: 2022-04-03 | Discharge: 2022-04-03 | Disposition: A | Discharge status: Home / Self Care | Payer: Medicare PPO | Source: Ambulatory Visit | Attending: Oncology | Admitting: Oncology

## 2022-04-03 DIAGNOSIS — C50912 Malignant neoplasm of unspecified site of left female breast: Secondary | ICD-10-CM | POA: Diagnosis not present

## 2022-04-03 DIAGNOSIS — M8589 Other specified disorders of bone density and structure, multiple sites: Secondary | ICD-10-CM | POA: Diagnosis not present

## 2022-04-03 DIAGNOSIS — Z170 Estrogen receptor positive status [ER+]: Secondary | ICD-10-CM | POA: Diagnosis present

## 2022-04-03 DIAGNOSIS — Z780 Asymptomatic menopausal state: Secondary | ICD-10-CM | POA: Diagnosis not present

## 2022-04-18 ENCOUNTER — Other Ambulatory Visit: Payer: Self-pay | Admitting: Physician Assistant

## 2022-08-14 ENCOUNTER — Encounter: Payer: Self-pay | Admitting: Oncology

## 2022-08-14 ENCOUNTER — Inpatient Hospital Stay: Payer: Medicare PPO

## 2022-08-14 ENCOUNTER — Inpatient Hospital Stay: Payer: Medicare PPO | Admitting: Oncology

## 2022-08-14 ENCOUNTER — Inpatient Hospital Stay: Payer: Medicare PPO | Attending: Oncology

## 2022-08-14 VITALS — BP 135/73 | HR 78 | Temp 97.3°F | Resp 18 | Wt 220.4 lb

## 2022-08-14 DIAGNOSIS — M85852 Other specified disorders of bone density and structure, left thigh: Secondary | ICD-10-CM

## 2022-08-14 DIAGNOSIS — M85859 Other specified disorders of bone density and structure, unspecified thigh: Secondary | ICD-10-CM | POA: Insufficient documentation

## 2022-08-14 DIAGNOSIS — C50912 Malignant neoplasm of unspecified site of left female breast: Secondary | ICD-10-CM | POA: Diagnosis not present

## 2022-08-14 DIAGNOSIS — Z79811 Long term (current) use of aromatase inhibitors: Secondary | ICD-10-CM | POA: Diagnosis not present

## 2022-08-14 DIAGNOSIS — Z170 Estrogen receptor positive status [ER+]: Secondary | ICD-10-CM

## 2022-08-14 DIAGNOSIS — M8589 Other specified disorders of bone density and structure, multiple sites: Secondary | ICD-10-CM | POA: Diagnosis present

## 2022-08-14 LAB — CBC WITH DIFFERENTIAL/PLATELET
Abs Immature Granulocytes: 0.03 10*3/uL (ref 0.00–0.07)
Basophils Absolute: 0.1 10*3/uL (ref 0.0–0.1)
Basophils Relative: 1 %
Eosinophils Absolute: 0.1 10*3/uL (ref 0.0–0.5)
Eosinophils Relative: 2 %
HCT: 36.8 % (ref 36.0–46.0)
Hemoglobin: 11.8 g/dL — ABNORMAL LOW (ref 12.0–15.0)
Immature Granulocytes: 1 %
Lymphocytes Relative: 22 %
Lymphs Abs: 1.4 10*3/uL (ref 0.7–4.0)
MCH: 27.6 pg (ref 26.0–34.0)
MCHC: 32.1 g/dL (ref 30.0–36.0)
MCV: 86.2 fL (ref 80.0–100.0)
Monocytes Absolute: 0.3 10*3/uL (ref 0.1–1.0)
Monocytes Relative: 5 %
Neutro Abs: 4.4 10*3/uL (ref 1.7–7.7)
Neutrophils Relative %: 69 %
Platelets: 279 10*3/uL (ref 150–400)
RBC: 4.27 MIL/uL (ref 3.87–5.11)
RDW: 15.5 % (ref 11.5–15.5)
WBC: 6.3 10*3/uL (ref 4.0–10.5)
nRBC: 0 % (ref 0.0–0.2)

## 2022-08-14 LAB — COMPREHENSIVE METABOLIC PANEL
ALT: 23 U/L (ref 0–44)
AST: 22 U/L (ref 15–41)
Albumin: 3.5 g/dL (ref 3.5–5.0)
Alkaline Phosphatase: 77 U/L (ref 38–126)
Anion gap: 8 (ref 5–15)
BUN: 16 mg/dL (ref 8–23)
CO2: 25 mmol/L (ref 22–32)
Calcium: 8.7 mg/dL — ABNORMAL LOW (ref 8.9–10.3)
Chloride: 100 mmol/L (ref 98–111)
Creatinine, Ser: 0.84 mg/dL (ref 0.44–1.00)
GFR, Estimated: >60 mL/min (ref >60)
Glucose, Bld: 245 mg/dL — ABNORMAL HIGH (ref 70–99)
Potassium: 3.7 mmol/L (ref 3.5–5.1)
Sodium: 133 mmol/L — ABNORMAL LOW (ref 135–145)
Total Bilirubin: 0.4 mg/dL (ref 0.3–1.2)
Total Protein: 6.9 g/dL (ref 6.5–8.1)

## 2022-08-14 MED ORDER — DENOSUMAB 60 MG/ML ~~LOC~~ SOSY
60.0000 mg | PREFILLED_SYRINGE | Freq: Once | SUBCUTANEOUS | Status: AC
  Administered 2022-08-14: 60 mg via SUBCUTANEOUS
  Filled 2022-08-14: qty 1

## 2022-08-14 NOTE — Progress Notes (Signed)
Pt here for follow up. No new concerns voiced. No new breast problems  

## 2022-08-14 NOTE — Assessment & Plan Note (Addendum)
Continue calcium and vitamin D supplementation 10/31/2019 DEXA showed osteopenia, FRAX hip fracture in 10 years 15.7%. 04/03/22 DEXA -osteopenia, stable Proceed with prolia.

## 2022-08-14 NOTE — Assessment & Plan Note (Addendum)
History of Stage IIB left breast cancer, ER positive, HER2 positive. S/p left mastectomy Labs are reviewed and discussed with patient. Refilled letrozole 2.5 mg daily.  Plan 10 years- till Feb 2026 02/28/22 diagnostic right mammogram-no malignancy.  Repeat mammogram in Feb 2025

## 2022-08-14 NOTE — Progress Notes (Signed)
Hematology/Oncology Progress note [phone] [phone]) [phone]    Chief Complaint: [place] is a 72 y.o. female presents for follow up of  stage IIB left breast cancer   ASSESSMENT & PLAN:   Cancer of left breast ([facility]) History of Stage IIB left breast cancer, ER positive, HER2 positive. S/p left mastectomy Labs are reviewed and discussed with patient. Refilled letrozole 2.5 mg daily.  Plan 10 years- till Feb 2026 02/28/22 diagnostic right mammogram-no malignancy.  Repeat mammogram in Feb 2025  Osteopenia of femoral neck Continue calcium and vitamin D supplementation 10/31/2019 DEXA showed osteopenia, FRAX hip fracture in 10 years 15.7%. 04/03/22 DEXA -osteopenia, stable Proceed with prolia.    Orders Placed This Encounter  Procedures   MM 3D SCREENING MAMMOGRAM BILATERAL BREAST    Standing Status:   Future    Standing Expiration Date:   08/14/2023    Order Specific Question:   Reason for Exam (SYMPTOM  OR DIAGNOSIS REQUIRED)    Answer:   hx breast cancer    Order Specific Question:   Preferred imaging location?    Answer:   [facility]   Cancer antigen 15-3    Standing Status:   Future    Standing Expiration Date:   08/14/2023   Cancer antigen 27.29    Standing Status:   Future    Standing Expiration Date:   08/14/2023   CMP (Cancer Center only)    Standing Status:   Future    Standing Expiration Date:   08/14/2023   CBC with Differential (Cancer Center Only)    Standing Status:   Future    Standing Expiration Date:   08/14/2023   Follow up in 6 months. All questions were answered. The patient knows to call the clinic with any problems, questions or concerns.  Rickard Patience, MD, PhD [facility] Hematology Oncology 08/14/2022   HPI:  Patient previously followed up by Dr.Corcoran, patient switched care to me on 02/12/21 Extensive medical record review was performed by me  clinical stage IIB Her2/neu + left breast cancer s/p mastectomy and  axillary lymph node dissection after neoadjuvant chemotherapy.   04/07/2013 mammogram revealed a 4 cm irregular mass located within the subareolar portion of the left breast with nipple retraction and thickening of the skin of the nipple. There were at least 3 abnormal appearing level I left axillary lymph nodes.  04/13/2013 Ultrasound guided breast biopsy revealed grade III invasive mammary carcinoma with no special type. Lymphovascular invasion was present.  Left axillary lymph node biopsy revealed metastatic carcinoma. Tumor was ER positive (1-5%), PR positive (1%) and HER-2/neu positive by FISH.  Clinical stage was T2N1M0.   05/07/2013 PET scan revealed no evidence of metastatic disease.     She enrolled on NSABP B-52 and randomized to carboplatin, Taxotere, Herceptin and Perjeta. She received 6 cycles of therapy from 05/10/2013 - 08/23/2013. She began maintenance Herceptin on 09/13/2013 and completed a year of therapy in 04/2014.  Left modified radical mastectomy and axillary lymph node dissection on 10/05/2013 revealed residual focal grade III invasive carcinoma, with microscopic foci, with lymphovascular invasion.  Twelve lymph nodes were negative for malignancy.  Pathologic stage was ypT1a(m) ypN0 Per note she began radiation therapy in 11/2013. She was complicated by cellulitis of the left chest wall with staph aureus. February 2016 patient takes aromatase inhibitor-letrozole for adjuvant endocrine therapy BCI testing on 08/27/2018 revealed a 14.5% risk of late recurrence (95% CI:  8-20.4%) and a benefit of extended endocrine therapy.  Mediport has been removed.  INTERVAL HISTORY [place] is a 72 y.o. female who has above history reviewed by me today presents for follow up visit for breast cancer  Patient has osteopenia. She takes calcium and supplementation.  She feels well. Tolerates letrozole. She take calcium supplementation.  Patient denies any breast complaints.   Past  Medical History:  Diagnosis Date   Arthritis    Breast cancer ([facility]) 2015   LT MASTECTOMY   Cataract    Depression    Diabetes mellitus without complication ([facility])    Dysfunctional uterine bleeding    History of colonic polyps    Hypertension    Hypothyroidism 07/09/2007   Personal history of chemotherapy 2015   BREAST CA   Personal history of radiation therapy 2015   BREAST CA    Past Surgical History:  Procedure Laterality Date   BREAST BIOPSY Left 03/2013   positive   BREAST BIOPSY Right 10/08/2017   rt stereo x clip- LUMINAL AND STROMAL CALCIFICATIONS ASSOCIATED WITH MICROCYSTS AND    CATARACT EXTRACTION Bilateral 02/08/2013   CATARACT EXTRACTION W/PHACO Left 01/20/2018   Procedure: CATARACT EXTRACTION PHACO AND INTRAOCULAR LENS PLACEMENT ([facility])  LEFT DIABETIC;  Surgeon: Lockie Mola, MD;  [facility];  Service: Ophthalmology;  Laterality: Left;  Diabetic   COLONOSCOPY     COLONOSCOPY WITH PROPOFOL N/A 12/01/2014   Procedure: COLONOSCOPY WITH PROPOFOL;  Surgeon: Scot Jun, MD;  Location: [facility] ENDOSCOPY;  Service: Endoscopy;  Laterality: N/A;   COLONOSCOPY WITH PROPOFOL N/A 06/28/2020   Procedure: COLONOSCOPY WITH PROPOFOL;  Surgeon: Toledo, Boykin Nearing, MD;  Location: [facility] ENDOSCOPY;  Service: Gastroenterology;  Laterality: N/A;   ENDOMETRIAL BIOPSY     MASTECTOMY MODIFIED RADICAL Left 10/05/2013   BREAST CA   SKIN BIOPSY      Family History  Problem Relation Age of Onset   Cancer Other    Cancer Cousin    Breast cancer Cousin     Social History:  reports that she has never smoked. She has never used smokeless tobacco. She reports that she does not drink alcohol and does not use drugs. She is a retired Engineer, materials.  She lives in [place]. The patient is alone today.   Allergies:  Allergies  Allergen Reactions   Demerol [Meperidine] Other (See Comments)    "Hyper"   Penicillins Rash    Current Medications: Current  Outpatient Medications  Medication Sig Dispense Refill   ACCU-CHEK AVIVA PLUS test strip USE 2 (TWO) TIMES DAILY     acetaminophen (TYLENOL) 500 MG tablet Take 1,000 mg by mouth every 6 (six) hours as needed (for pain).     bisoprolol-hydrochlorothiazide (ZIAC) 5-6.25 MG per tablet Take 1 tablet by mouth at bedtime.     Calcium Carb-Cholecalciferol 500-400 MG-UNIT TABS Take 1 tablet by mouth 2 (two) times daily.     cetirizine (ZYRTEC) 10 MG tablet Take 10 mg by mouth daily.      ferrous sulfate 325 (65 FE) MG tablet Take 325 mg by mouth daily with breakfast.     FLUoxetine (PROZAC) 20 MG tablet Take 20 mg by mouth daily.     glimepiride (AMARYL) 4 MG tablet Take 4 mg by mouth 2 (two) times daily.     KLOR-CON M20 20 MEQ tablet TAKE 1 TABLET BY MOUTH ONCE A DAY 30 tablet 6   Lancets MISC Use 1 each 2 (two) times daily     letrozole (FEMARA) 2.5 MG tablet  Take 1 tablet (2.5 mg total) by mouth daily. 90 tablet 1   levothyroxine (SYNTHROID) 200 MCG tablet TAKE 1 TABLET(200 MCG) BY MOUTH EVERY DAY 30 TO 60 MINUTES BEFORE BREAKFAST ON AN EMPTY STOMACH AND WITH A GLASS OF WATER     magnesium chloride (SLOW-MAG) 64 MG TBEC SR tablet Take 1 tablet by mouth 2 (two) times daily.     meloxicam (MOBIC) 15 MG tablet Take 15 mg by mouth as needed.     pioglitazone (ACTOS) 30 MG tablet Take 30 mg by mouth daily.      simvastatin (ZOCOR) 20 MG tablet Take 20 mg by mouth at bedtime.     TRULICITY 1.5 MG/0.5ML SOPN INJECT 1.5 MG SUBCUTANEOUSLY EVERY 7 (SEVEN) DAYS     No current facility-administered medications for this visit.    Review of Systems  Constitutional:  Negative for appetite change, chills, fatigue and fever.  HENT:   Negative for hearing loss and voice change.   Eyes:  Negative for eye problems.       Status post cataract surgery.  Vision improved.  Respiratory:  Negative for chest tightness and cough.   Cardiovascular:  Negative for chest pain.  Gastrointestinal:  Negative for abdominal  distention, abdominal pain and blood in stool.  Endocrine: Negative for hot flashes.  Genitourinary:  Negative for difficulty urinating and frequency.   Musculoskeletal:  Positive for arthralgias.  Skin:  Negative for itching and rash.  Neurological:  Negative for extremity weakness.  Hematological:  Negative for adenopathy.  Psychiatric/Behavioral:  Negative for confusion.     Performance status (ECOG): 0 Today's Vitals   08/14/22 1351  BP: 135/73  Pulse: 78  Resp: 18  Temp: (!) 97.3 F (36.3 C)  Weight: 220 lb 6.4 oz (100 kg)  PainSc: 0-No pain   Body mass index is 35.57 kg/m.  Physical Exam Constitutional:      General: She is not in acute distress.    Appearance: She is not diaphoretic.  HENT:     Head: Normocephalic and atraumatic.  Eyes:     General: No scleral icterus. Cardiovascular:     Rate and Rhythm: Normal rate and regular rhythm.     Heart sounds: No murmur heard. Pulmonary:     Effort: Pulmonary effort is normal. No respiratory distress.  Abdominal:     General: There is no distension.     Palpations: Abdomen is soft.     Tenderness: There is no abdominal tenderness.  Musculoskeletal:        General: Normal range of motion.     Cervical back: Normal range of motion and neck supple.  Skin:    General: Skin is warm and dry.  Neurological:     Mental Status: She is alert and oriented to person, place, and time.     Cranial Nerves: No cranial nerve deficit.     Motor: No abnormal muscle tone.     Coordination: Coordination normal.  Psychiatric:        Mood and Affect: Affect normal.      Labs.    Latest Ref Rng & Units 08/14/2022    1:36 PM 02/13/2022    2:00 PM 08/13/2021    2:07 PM  CBC  WBC 4.0 - 10.5 K/uL 6.3  6.2  7.9   Hemoglobin 12.0 - 15.0 g/dL 54.0  98.1  19.1   Hematocrit 36.0 - 46.0 % 36.8  39.7  38.2   Platelets 150 - 400 K/uL 279  349  388       Latest Ref Rng & Units 08/14/2022    1:36 PM 02/13/2022    2:00 PM 08/13/2021     2:07 PM  CMP  Glucose 70 - 99 mg/dL 578  469  629   BUN 8 - 23 mg/dL 16  20  21    Creatinine 0.44 - 1.00 mg/dL 5.28  4.13  2.44   Sodium 135 - 145 mmol/L 133  135  134   Potassium 3.5 - 5.1 mmol/L 3.7  3.6  3.8   Chloride 98 - 111 mmol/L 100  103  100   CO2 22 - 32 mmol/L 25  23  24    Calcium 8.9 - 10.3 mg/dL 8.7  8.8  9.1   Total Protein 6.5 - 8.1 g/dL 6.9  7.5  7.7   Total Bilirubin 0.3 - 1.2 mg/dL 0.4  0.3  0.6   Alkaline Phos 38 - 126 U/L 77  93  112   AST 15 - 41 U/L 22  24  42   ALT 0 - 44 U/L 23  19  37    RADIOGRAPHIC STUDIES: I have personally reviewed the radiological images as listed and agreed with the findings in the report. No results found.

## 2023-02-27 ENCOUNTER — Ambulatory Visit
Admission: RE | Admit: 2023-02-27 | Discharge: 2023-02-27 | Disposition: A | Discharge status: Home / Self Care | Payer: Medicare PPO | Source: Ambulatory Visit | Attending: Oncology | Admitting: Oncology

## 2023-02-27 DIAGNOSIS — Z170 Estrogen receptor positive status [ER+]: Secondary | ICD-10-CM | POA: Insufficient documentation

## 2023-02-27 DIAGNOSIS — Z1231 Encounter for screening mammogram for malignant neoplasm of breast: Secondary | ICD-10-CM | POA: Insufficient documentation

## 2023-02-27 DIAGNOSIS — C50912 Malignant neoplasm of unspecified site of left female breast: Secondary | ICD-10-CM | POA: Insufficient documentation

## 2023-03-19 ENCOUNTER — Inpatient Hospital Stay: Payer: Medicare PPO | Attending: Oncology

## 2023-03-19 ENCOUNTER — Inpatient Hospital Stay: Payer: Medicare PPO | Admitting: Oncology

## 2023-03-19 ENCOUNTER — Encounter: Payer: Self-pay | Admitting: Oncology

## 2023-03-19 ENCOUNTER — Inpatient Hospital Stay: Payer: Medicare PPO

## 2023-03-19 VITALS — BP 106/74 | HR 76 | Temp 97.0°F | Resp 18 | Wt 202.1 lb

## 2023-03-19 DIAGNOSIS — Z170 Estrogen receptor positive status [ER+]: Secondary | ICD-10-CM

## 2023-03-19 DIAGNOSIS — M85859 Other specified disorders of bone density and structure, unspecified thigh: Secondary | ICD-10-CM

## 2023-03-19 DIAGNOSIS — C50912 Malignant neoplasm of unspecified site of left female breast: Secondary | ICD-10-CM | POA: Diagnosis not present

## 2023-03-19 DIAGNOSIS — Z853 Personal history of malignant neoplasm of breast: Secondary | ICD-10-CM | POA: Insufficient documentation

## 2023-03-19 DIAGNOSIS — R634 Abnormal weight loss: Secondary | ICD-10-CM | POA: Insufficient documentation

## 2023-03-19 DIAGNOSIS — M85852 Other specified disorders of bone density and structure, left thigh: Secondary | ICD-10-CM

## 2023-03-19 DIAGNOSIS — M8589 Other specified disorders of bone density and structure, multiple sites: Secondary | ICD-10-CM | POA: Diagnosis present

## 2023-03-19 LAB — CMP (CANCER CENTER ONLY)
ALT: 29 U/L (ref 0–44)
AST: 27 U/L (ref 15–41)
Albumin: 4 g/dL (ref 3.5–5.0)
Alkaline Phosphatase: 95 U/L (ref 38–126)
Anion gap: 8 (ref 5–15)
BUN: 16 mg/dL (ref 8–23)
CO2: 23 mmol/L (ref 22–32)
Calcium: 8.7 mg/dL — ABNORMAL LOW (ref 8.9–10.3)
Chloride: 99 mmol/L (ref 98–111)
Creatinine: 0.87 mg/dL (ref 0.44–1.00)
GFR, Estimated: >60 mL/min
Glucose, Bld: 183 mg/dL — ABNORMAL HIGH (ref 70–99)
Potassium: 4 mmol/L (ref 3.5–5.1)
Sodium: 130 mmol/L — ABNORMAL LOW (ref 135–145)
Total Bilirubin: 0.7 mg/dL (ref 0.0–1.2)
Total Protein: 7.2 g/dL (ref 6.5–8.1)

## 2023-03-19 LAB — CBC WITH DIFFERENTIAL (CANCER CENTER ONLY)
Abs Immature Granulocytes: 0.04 K/uL (ref 0.00–0.07)
Basophils Absolute: 0.1 K/uL (ref 0.0–0.1)
Basophils Relative: 1 %
Eosinophils Absolute: 0.2 K/uL (ref 0.0–0.5)
Eosinophils Relative: 2 %
HCT: 39 % (ref 36.0–46.0)
Hemoglobin: 12.7 g/dL (ref 12.0–15.0)
Immature Granulocytes: 1 %
Lymphocytes Relative: 21 %
Lymphs Abs: 1.6 K/uL (ref 0.7–4.0)
MCH: 28.5 pg (ref 26.0–34.0)
MCHC: 32.6 g/dL (ref 30.0–36.0)
MCV: 87.4 fL (ref 80.0–100.0)
Monocytes Absolute: 0.5 K/uL (ref 0.1–1.0)
Monocytes Relative: 6 %
Neutro Abs: 5.2 K/uL (ref 1.7–7.7)
Neutrophils Relative %: 69 %
Platelet Count: 345 K/uL (ref 150–400)
RBC: 4.46 MIL/uL (ref 3.87–5.11)
RDW: 13.1 % (ref 11.5–15.5)
WBC Count: 7.5 K/uL (ref 4.0–10.5)
nRBC: 0 % (ref 0.0–0.2)

## 2023-03-19 MED ORDER — LETROZOLE 2.5 MG PO TABS: 2.5000 mg | ORAL_TABLET | Freq: Every day | ORAL | 1 refills | Status: DC

## 2023-03-19 MED ORDER — DENOSUMAB 60 MG/ML ~~LOC~~ SOSY
60.0000 mg | PREFILLED_SYRINGE | Freq: Once | SUBCUTANEOUS | Status: AC
  Administered 2023-03-19: 60 mg via SUBCUTANEOUS
  Filled 2023-03-19: qty 1

## 2023-03-19 NOTE — Assessment & Plan Note (Signed)
 Possibly due to medication side effects.  She has no symptoms.  Observation.

## 2023-03-19 NOTE — Assessment & Plan Note (Signed)
Continue calcium and vitamin D supplementation 10/31/2019 DEXA showed osteopenia, FRAX hip fracture in 10 years 15.7%. 04/03/22 DEXA -osteopenia, stable Proceed with prolia.

## 2023-03-19 NOTE — Progress Notes (Signed)
 Hematology/Oncology Progress note [phone] [phone]    Chief Complaint: Mary Brady is a 73 y.o. female presents for follow up of  stage IIB left breast cancer   ASSESSMENT & PLAN:   Cancer of left breast (HCC) History of Stage IIB left breast cancer, ER positive, HER2 positive. S/p left mastectomy Labs are reviewed and discussed with patient. Refilled letrozole 2.5 mg daily.  Plan 10 years- till Feb 2026 03/04/23 diagnostic right mammogram-no malignancy.    Osteopenia of femoral neck Continue calcium and vitamin D supplementation 10/31/2019 DEXA showed osteopenia, FRAX hip fracture in 10 years 15.7%. 04/03/22 DEXA -osteopenia, stable Proceed with prolia.    Orders Placed This Encounter  Procedures   CBC with Differential (Cancer Center Only)    Standing Status:   Future    Expected Date:   09/19/2023    Expiration Date:   03/18/2024   CMP (Cancer Center only)    Standing Status:   Future    Expected Date:   09/19/2023    Expiration Date:   03/18/2024   Follow up in 6 months. All questions were answered. The patient knows to call the clinic with any problems, questions or concerns.  Rickard Patience, MD, PhD [facility] Hematology Oncology 03/19/2023   HPI:  Patient previously followed up by Dr.Corcoran, patient switched care to me on 02/12/21 Extensive medical record review was performed by me  clinical stage IIB Her2/neu + left breast cancer s/p mastectomy and axillary lymph node dissection after neoadjuvant chemotherapy.   04/07/2013 mammogram revealed a 4 cm irregular mass located within the subareolar portion of the left breast with nipple retraction and thickening of the skin of the nipple. There were at least 3 abnormal appearing level I left axillary lymph nodes.  04/13/2013 Ultrasound guided breast biopsy revealed grade III invasive mammary carcinoma with no special type. Lymphovascular invasion was present.  Left axillary lymph node biopsy  revealed metastatic carcinoma. Tumor was ER positive (1-5%), PR positive (1%) and HER-2/neu positive by FISH.  Clinical stage was T2N1M0.   05/07/2013 PET scan revealed no evidence of metastatic disease.     She enrolled on NSABP B-52 and randomized to carboplatin, Taxotere, Herceptin and Perjeta. She received 6 cycles of therapy from 05/10/2013 - 08/23/2013. She began maintenance Herceptin on 09/13/2013 and completed a year of therapy in 04/2014.  Left modified radical mastectomy and axillary lymph node dissection on 10/05/2013 revealed residual focal grade III invasive carcinoma, with microscopic foci, with lymphovascular invasion.  Twelve lymph nodes were negative for malignancy.  Pathologic stage was ypT1a(m) ypN0 Per note she began radiation therapy in 11/2013. She was complicated by cellulitis of the left chest wall with staph aureus. February 2016 patient takes aromatase inhibitor-letrozole for adjuvant endocrine therapy BCI testing on 08/27/2018 revealed a 14.5% risk of late recurrence (95% CI:  8-20.4%) and a benefit of extended endocrine therapy.  Mediport has been removed.  INTERVAL HISTORY Mary Brady is a 73 y.o. female who has above history reviewed by me today presents for follow up visit for breast cancer  She feels well. Tolerates letrozole. She take calcium supplementation.  Patient denies any breast complaints. Chronic hip pain. She has been on Trulicity for years and recently experienced weight loss of 20 pounds.  She denies nausea vomiting, epigastric or abdominal pain, blood in stool. She is up to date on colonoscopy.   Past Medical History:  Diagnosis Date   Arthritis    Breast cancer (HCC) 2015  LT MASTECTOMY   Cataract    Depression    Diabetes mellitus without complication (HCC)    Dysfunctional uterine bleeding    History of colonic polyps    Hypertension    Hypothyroidism 07/09/2007   Personal history of chemotherapy 2015   BREAST CA   Personal  history of radiation therapy 2015   BREAST CA    Past Surgical History:  Procedure Laterality Date   BREAST BIOPSY Left 03/2013   positive   BREAST BIOPSY Right 10/08/2017   rt stereo x clip- LUMINAL AND STROMAL CALCIFICATIONS ASSOCIATED WITH MICROCYSTS AND    CATARACT EXTRACTION Bilateral 02/08/2013   CATARACT EXTRACTION W/PHACO Left 01/20/2018   Procedure: CATARACT EXTRACTION PHACO AND INTRAOCULAR LENS PLACEMENT (IOC)  LEFT DIABETIC;  Surgeon: Lockie Mola, MD;  Location: [facility];  Service: Ophthalmology;  Laterality: Left;  Diabetic   COLONOSCOPY     COLONOSCOPY WITH PROPOFOL N/A 12/01/2014   Procedure: COLONOSCOPY WITH PROPOFOL;  Surgeon: Scot Jun, MD;  Location: [facility] END[facility]COPY;  Service: Endoscopy;  Laterality: N/A;   COLONOSCOPY WITH PROPOFOL N/A 06/28/2020   Procedure: COLONOSCOPY WITH PROPOFOL;  Surgeon: Toledo, Boykin Nearing, MD;  Location: [facility] ENDOSCOPY;  Service: Gastroenterology;  Laterality: N/A;   ENDOMETRIAL BIOPSY     MASTECTOMY MODIFIED RADICAL Left 10/05/2013   BREAST CA   SKIN BIOPSY      Family History  Problem Relation Age of Onset   Cancer Other    Cancer Cousin    Breast cancer Cousin     Social History:  reports that she has never smoked. She has never used smokeless tobacco. She reports that she does not drink alcohol and does not use drugs. She is a retired Engineer, materials.  She lives in [place]. The patient is alone today.   Allergies:  Allergies  Allergen Reactions   Demerol [Meperidine] Other (See Comments)    "Hyper"   Penicillins Rash    Current Medications: Current Outpatient Medications  Medication Sig Dispense Refill   ACCU-CHEK AVIVA PLUS test strip USE 2 (TWO) TIMES DAILY     acetaminophen (TYLENOL) 500 MG tablet Take 1,000 mg by mouth every 6 (six) hours as needed (for pain).     bisoprolol-hydrochlorothiazide (ZIAC) 5-6.25 MG per tablet Take 1 tablet by mouth at bedtime.     Calcium Carb-Cholecalciferol  500-400 MG-UNIT TABS Take 1 tablet by mouth 2 (two) times daily.     cetirizine (ZYRTEC) 10 MG tablet Take 10 mg by mouth daily.      ferrous sulfate 325 (65 FE) MG tablet Take 325 mg by mouth daily with breakfast.     FLUoxetine (PROZAC) 20 MG tablet Take 20 mg by mouth daily.     glimepiride (AMARYL) 4 MG tablet Take 4 mg by mouth 2 (two) times daily.     KLOR-CON M20 20 MEQ tablet TAKE 1 TABLET BY MOUTH ONCE A DAY 30 tablet 6   Lancets MISC Use 1 each 2 (two) times daily     levothyroxine (SYNTHROID) 200 MCG tablet TAKE 1 TABLET(200 MCG) BY MOUTH EVERY DAY 30 TO 60 MINUTES BEFORE BREAKFAST ON AN EMPTY STOMACH AND WITH A GLASS OF WATER     magnesium chloride (SLOW-MAG) 64 MG TBEC SR tablet Take 1 tablet by mouth 2 (two) times daily.     meloxicam (MOBIC) 15 MG tablet Take 15 mg by mouth as needed.     pioglitazone (ACTOS) 30 MG tablet Take 30 mg by mouth daily.  simvastatin (ZOCOR) 20 MG tablet Take 20 mg by mouth at bedtime.     TRULICITY 1.5 MG/0.5ML SOPN INJECT 1.5 MG SUBCUTANEOUSLY EVERY 7 (SEVEN) DAYS     letrozole (FEMARA) 2.5 MG tablet Take 1 tablet (2.5 mg total) by mouth daily. 90 tablet 1   No current facility-administered medications for this visit.    Review of Systems  Constitutional:  Negative for appetite change, chills, fatigue and fever.  HENT:   Negative for hearing loss and voice change.   Eyes:  Negative for eye problems.  Respiratory:  Negative for chest tightness and cough.   Cardiovascular:  Negative for chest pain.  Gastrointestinal:  Negative for abdominal distention, abdominal pain and blood in stool.  Endocrine: Negative for hot flashes.  Genitourinary:  Negative for difficulty urinating and frequency.   Musculoskeletal:  Positive for arthralgias.  Skin:  Negative for itching and rash.  Neurological:  Negative for extremity weakness.  Hematological:  Negative for adenopathy.  Psychiatric/Behavioral:  Negative for confusion.     Performance status  (ECOG): 0 Today's Vitals   03/19/23 1339  BP: 106/74  Pulse: 76  Resp: 18  Temp: (!) 97 F (36.1 C)  TempSrc: Tympanic  SpO2: 98%  Weight: 202 lb 1.6 oz (91.7 kg)  PainSc: 0-No pain   Body mass index is 32.62 kg/m.  Physical Exam Constitutional:      General: She is not in acute distress.    Appearance: She is not diaphoretic.  HENT:     Head: Normocephalic and atraumatic.  Eyes:     General: No scleral icterus. Cardiovascular:     Rate and Rhythm: Normal rate and regular rhythm.  Pulmonary:     Effort: Pulmonary effort is normal. No respiratory distress.  Abdominal:     General: There is no distension.     Palpations: Abdomen is soft.     Tenderness: There is no abdominal tenderness.  Musculoskeletal:        General: Normal range of motion.     Cervical back: Normal range of motion and neck supple.  Skin:    General: Skin is warm and dry.  Neurological:     Mental Status: She is alert and oriented to person, place, and time.     Cranial Nerves: No cranial nerve deficit.     Motor: No abnormal muscle tone.     Coordination: Coordination normal.  Psychiatric:        Mood and Affect: Mood and affect normal.      Labs.    Latest Ref Rng & Units 03/19/2023    1:31 PM 08/14/2022    1:36 PM 02/13/2022    2:00 PM  CBC  WBC 4.0 - 10.5 K/uL 7.5  6.3  6.2   Hemoglobin 12.0 - 15.0 g/dL 03.4  74.2  59.5   Hematocrit 36.0 - 46.0 % 39.0  36.8  39.7   Platelets 150 - 400 K/uL 345  279  349       Latest Ref Rng & Units 03/19/2023    1:31 PM 08/14/2022    1:36 PM 02/13/2022    2:00 PM  CMP  Glucose 70 - 99 mg/dL 638  756  433   BUN 8 - 23 mg/dL 16  16  20    Creatinine 0.44 - 1.00 mg/dL 2.95  1.88  4.16   Sodium 135 - 145 mmol/L 130  133  135   Potassium 3.5 - 5.1 mmol/L 4.0  3.7  3.6  Chloride 98 - 111 mmol/L 99  100  103   CO2 22 - 32 mmol/L 23  25  23    Calcium 8.9 - 10.3 mg/dL 8.7  8.7  8.8   Total Protein 6.5 - 8.1 g/dL 7.2  6.9  7.5   Total Bilirubin 0.0 - 1.2  mg/dL 0.7  0.4  0.3   Alkaline Phos 38 - 126 U/L 95  77  93   AST 15 - 41 U/L 27  22  24    ALT 0 - 44 U/L 29  23  19     RADIOGRAPHIC STUDIES: I have personally reviewed the radiological images as listed and agreed with the findings in the report. MM 3D SCREENING MAMMOGRAM UNILATERAL RIGHT BREAST Result Date: 03/04/2023 CLINICAL DATA:  Screening. EXAM: DIGITAL SCREENING UNILATERAL RIGHT MAMMOGRAM WITH CAD AND TOMOSYNTHESIS TECHNIQUE: Right screening digital craniocaudal and mediolateral oblique mammograms were obtained. Right screening digital breast tomosynthesis was performed. The images were evaluated with computer-aided detection. COMPARISON:  Previous exam(s). ACR Breast Density Category c: The breasts are heterogeneously dense, which may obscure small masses. FINDINGS: There are no findings suspicious for malignancy. IMPRESSION: No mammographic evidence of malignancy. A result letter of this screening mammogram will be mailed directly to the patient. RECOMMENDATION: Screening mammogram in one year. (Code:SM-B-01Y) BI-RADS CATEGORY  1: Negative. Electronically Signed   By: Meda Klinefelter M.D.   On: 03/04/2023 13:34

## 2023-03-19 NOTE — Assessment & Plan Note (Addendum)
 History of Stage IIB left breast cancer, ER positive, HER2 positive. S/p left mastectomy Labs are reviewed and discussed with patient. Refilled letrozole 2.5 mg daily.  Plan 10 years- till Feb 2026 03/04/23 diagnostic right mammogram-no malignancy.

## 2023-03-20 LAB — CANCER ANTIGEN 27.29: CA 27.29: 23.5 U/mL (ref 0.0–38.6)

## 2023-03-20 LAB — CANCER ANTIGEN 15-3: CA 15-3: 23.7 U/mL (ref 0.0–25.0)

## 2023-09-17 ENCOUNTER — Inpatient Hospital Stay: Attending: Oncology

## 2023-09-17 ENCOUNTER — Inpatient Hospital Stay

## 2023-09-17 ENCOUNTER — Inpatient Hospital Stay: Admitting: Oncology

## 2023-09-17 ENCOUNTER — Encounter: Payer: Self-pay | Admitting: Oncology

## 2023-09-17 VITALS — BP 122/78 | HR 92 | Temp 97.7°F | Resp 18 | Wt 207.0 lb

## 2023-09-17 DIAGNOSIS — Z170 Estrogen receptor positive status [ER+]: Secondary | ICD-10-CM

## 2023-09-17 DIAGNOSIS — M85859 Other specified disorders of bone density and structure, unspecified thigh: Secondary | ICD-10-CM

## 2023-09-17 DIAGNOSIS — C773 Secondary and unspecified malignant neoplasm of axilla and upper limb lymph nodes: Secondary | ICD-10-CM | POA: Diagnosis not present

## 2023-09-17 DIAGNOSIS — Z79811 Long term (current) use of aromatase inhibitors: Secondary | ICD-10-CM | POA: Insufficient documentation

## 2023-09-17 DIAGNOSIS — C50112 Malignant neoplasm of central portion of left female breast: Secondary | ICD-10-CM | POA: Diagnosis not present

## 2023-09-17 DIAGNOSIS — C50912 Malignant neoplasm of unspecified site of left female breast: Secondary | ICD-10-CM | POA: Diagnosis not present

## 2023-09-17 DIAGNOSIS — Z1731 Human epidermal growth factor receptor 2 positive status: Secondary | ICD-10-CM | POA: Diagnosis not present

## 2023-09-17 DIAGNOSIS — M85852 Other specified disorders of bone density and structure, left thigh: Secondary | ICD-10-CM | POA: Insufficient documentation

## 2023-09-17 LAB — CMP (CANCER CENTER ONLY)
ALT: 37 U/L (ref 0–44)
AST: 41 U/L (ref 15–41)
Albumin: 4.2 g/dL (ref 3.5–5.0)
Alkaline Phosphatase: 91 U/L (ref 38–126)
Anion gap: 11 (ref 5–15)
BUN: 15 mg/dL (ref 8–23)
CO2: 24 mmol/L (ref 22–32)
Calcium: 8.9 mg/dL (ref 8.9–10.3)
Chloride: 95 mmol/L — ABNORMAL LOW (ref 98–111)
Creatinine: 1 mg/dL (ref 0.44–1.00)
GFR, Estimated: 60 mL/min — ABNORMAL LOW (ref >60)
Glucose, Bld: 318 mg/dL — ABNORMAL HIGH (ref 70–99)
Potassium: 4.2 mmol/L (ref 3.5–5.1)
Sodium: 130 mmol/L — ABNORMAL LOW (ref 135–145)
Total Bilirubin: 0.7 mg/dL (ref 0.0–1.2)
Total Protein: 7.9 g/dL (ref 6.5–8.1)

## 2023-09-17 LAB — CBC WITH DIFFERENTIAL (CANCER CENTER ONLY)
Abs Immature Granulocytes: 0.04 K/uL (ref 0.00–0.07)
Basophils Absolute: 0.1 K/uL (ref 0.0–0.1)
Basophils Relative: 1 %
Eosinophils Absolute: 0.1 K/uL (ref 0.0–0.5)
Eosinophils Relative: 2 %
HCT: 42.3 % (ref 36.0–46.0)
Hemoglobin: 13.6 g/dL (ref 12.0–15.0)
Immature Granulocytes: 1 %
Lymphocytes Relative: 16 %
Lymphs Abs: 1.3 K/uL (ref 0.7–4.0)
MCH: 27.7 pg (ref 26.0–34.0)
MCHC: 32.2 g/dL (ref 30.0–36.0)
MCV: 86.2 fL (ref 80.0–100.0)
Monocytes Absolute: 0.4 K/uL (ref 0.1–1.0)
Monocytes Relative: 5 %
Neutro Abs: 6.2 K/uL (ref 1.7–7.7)
Neutrophils Relative %: 75 %
Platelet Count: 338 K/uL (ref 150–400)
RBC: 4.91 MIL/uL (ref 3.87–5.11)
RDW: 13 % (ref 11.5–15.5)
WBC Count: 8.2 K/uL (ref 4.0–10.5)
nRBC: 0 % (ref 0.0–0.2)

## 2023-09-17 MED ORDER — LETROZOLE 2.5 MG PO TABS: 2.5000 mg | ORAL_TABLET | Freq: Every day | ORAL | 1 refills | Status: AC

## 2023-09-17 MED ORDER — DENOSUMAB 60 MG/ML ~~LOC~~ SOSY
60.0000 mg | PREFILLED_SYRINGE | Freq: Once | SUBCUTANEOUS | Status: AC
  Administered 2023-09-17: 60 mg via SUBCUTANEOUS
  Filled 2023-09-17: qty 1

## 2023-09-17 NOTE — Assessment & Plan Note (Addendum)
 Continue calcium and vitamin D supplementation 10/31/2019 DEXA showed osteopenia, FRAX hip fracture in 10 years 15.7%. 04/03/22 DEXA -osteopenia, stable Proceed with prolia .  Repeat DEXA in March 2026

## 2023-09-17 NOTE — Assessment & Plan Note (Addendum)
 History of Stage IIB left breast cancer, ER positive, HER2 positive. S/p left mastectomy Labs are reviewed and discussed with patient. Refilled letrozole 2.5 mg daily.  Plan 10 years- till Feb 2026 03/04/23 diagnostic right mammogram-no malignancy.

## 2023-09-17 NOTE — Progress Notes (Signed)
 Hematology/Oncology Progress note [phone] [phone]) [phone]    Chief Complaint: Mary Brady is a 73 y.o. female presents for follow up of  stage IIB left breast cancer   ASSESSMENT & PLAN:   Cancer of left breast (HCC) History of Stage IIB left breast cancer, ER positive, HER2 positive. S/p left mastectomy Labs are reviewed and discussed with patient. Refilled letrozole  2.5 mg daily.  Plan 10 years- till Feb 2026 03/04/23 diagnostic right mammogram-no malignancy.   Osteopenia of femoral neck Continue calcium and vitamin D supplementation 10/31/2019 DEXA showed osteopenia, FRAX hip fracture in 10 years 15.7%. 04/03/22 DEXA -osteopenia, stable Proceed with prolia .  Repeat DEXA in March 2026   Orders Placed This Encounter  Procedures   MM 3D SCREENING MAMMOGRAM UNILATERAL RIGHT BREAST    Standing Status:   Future    Expiration Date:   09/16/2024    Reason for Exam (SYMPTOM  OR DIAGNOSIS REQUIRED):   hx breast cancer    Preferred imaging location?:   [facility]   DG Bone Density    Standing Status:   Future    Expected Date:   03/17/2024    Expiration Date:   09/16/2024    Reason for Exam (SYMPTOM  OR DIAGNOSIS REQUIRED):   hx breast cancer    Preferred imaging location?:   [facility]   CMP (Cancer Center only)    Standing Status:   Future    Expected Date:   03/17/2024    Expiration Date:   06/15/2024   CBC with Differential (Cancer Center Only)    Standing Status:   Future    Expected Date:   03/17/2024    Expiration Date:   06/15/2024   Cancer antigen 27.29    Standing Status:   Future    Expected Date:   03/17/2024    Expiration Date:   06/15/2024   Cancer antigen 15-3    Standing Status:   Future    Expected Date:   03/17/2024    Expiration Date:   06/15/2024   Follow up in 6 months. All questions were answered. The patient knows to call the clinic with any problems, questions or concerns.  Zelphia Cap, MD, PhD [facility] Hematology  Oncology 09/17/2023   HPI:  Patient previously followed up by Dr.Corcoran, patient switched care to me on 02/12/21 Extensive medical record review was performed by me  clinical stage IIB Her2/neu + left breast cancer s/p mastectomy and axillary lymph node dissection after neoadjuvant chemotherapy.   04/07/2013 mammogram revealed a 4 cm irregular mass located within the subareolar portion of the left breast with nipple retraction and thickening of the skin of the nipple. There were at least 3 abnormal appearing level I left axillary lymph nodes.  04/13/2013 Ultrasound guided breast biopsy revealed grade III invasive mammary carcinoma with no special type. Lymphovascular invasion was present.  Left axillary lymph node biopsy revealed metastatic carcinoma. Tumor was ER positive (1-5%), PR positive (1%) and HER-2/neu positive by FISH.  Clinical stage was T2N1M0.   05/07/2013 PET scan revealed no evidence of metastatic disease.     She enrolled on NSABP B-52 and randomized to carboplatin, Taxotere, Herceptin and Perjeta. She received 6 cycles of therapy from 05/10/2013 - 08/23/2013. She began maintenance Herceptin on 09/13/2013 and completed a year of therapy in 04/2014.  Left modified radical mastectomy and axillary lymph node dissection on 10/05/2013 revealed residual focal grade III invasive carcinoma, with microscopic foci, with lymphovascular invasion.  Twelve lymph  nodes were negative for malignancy.  Pathologic stage was ypT1a(m) ypN0 Per note she began radiation therapy in 11/2013. She was complicated by cellulitis of the left chest wall with staph aureus. February 2016 patient takes aromatase inhibitor-letrozole  for adjuvant endocrine therapy BCI testing on 08/27/2018 revealed a 14.5% risk of late recurrence (95% CI:  8-20.4%) and a benefit of extended endocrine therapy.  Mediport has been removed.   She has been on Trulicity for years and recently experienced weight loss of 20 pounds.    INTERVAL HISTORY Mary Brady is a 73 y.o. female who has above history reviewed by me today presents for follow up visit for breast cancer  She feels well. Tolerates letrozole . She take calcium supplementation.  Patient denies any breast complaints. Chronic hip pain.   Past Medical History:  Diagnosis Date   Arthritis    Breast cancer (HCC) 2015   LT MASTECTOMY   Cataract    Depression    Diabetes mellitus without complication (HCC)    Dysfunctional uterine bleeding    History of colonic polyps    Hypertension    Hypothyroidism 07/09/2007   Personal history of chemotherapy 2015   BREAST CA   Personal history of radiation therapy 2015   BREAST CA    Past Surgical History:  Procedure Laterality Date   BREAST BIOPSY Left 03/2013   positive   BREAST BIOPSY Right 10/08/2017   rt stereo x clip- LUMINAL AND STROMAL CALCIFICATIONS ASSOCIATED WITH MICROCYSTS AND    CATARACT EXTRACTION Bilateral 02/08/2013   CATARACT EXTRACTION W/PHACO Left 01/20/2018   Procedure: CATARACT EXTRACTION PHACO AND INTRAOCULAR LENS PLACEMENT (IOC)  LEFT DIABETIC;  Surgeon: Mittie Gaskin, MD;  Location: [facility];  Service: Ophthalmology;  Laterality: Left;  Diabetic   COLONOSCOPY     COLONOSCOPY WITH PROPOFOL  N/A 12/01/2014   Procedure: COLONOSCOPY WITH PROPOFOL ;  Surgeon: Lamar ONEIDA Holmes, MD;  Location: [facility] END[facility];  Service: Endoscopy;  Laterality: N/A;   COLONOSCOPY WITH PROPOFOL  N/A 06/28/2020   Procedure: COLONOSCOPY WITH PROPOFOL ;  Surgeon: Toledo, Ladell POUR, MD;  Location: [facility] ENDOSCOPY;  Service: Gastroenterology;  Laterality: N/A;   ENDOMETRIAL BIOPSY     MASTECTOMY MODIFIED RADICAL Left 10/05/2013   BREAST CA   SKIN BIOPSY      Family History  Problem Relation Age of Onset   Cancer Other    Cancer Cousin    Breast cancer Cousin     Social History:  reports that she has never smoked. She has never used smokeless tobacco. She reports that she does not drink  alcohol and does not use drugs. 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 The patient is alone today.   Allergies:  Allergies  Allergen Reactions   Demerol [Meperidine] Other (See Comments)    Hyper   Penicillins Rash    Current Medications: Current Outpatient Medications  Medication Sig Dispense Refill   ACCU-CHEK AVIVA PLUS test strip USE 2 (TWO) TIMES DAILY     acetaminophen  (TYLENOL ) 500 MG tablet Take 1,000 mg by mouth every 6 (six) hours as needed (for pain).     bisoprolol-hydrochlorothiazide (ZIAC) 5-6.25 MG per tablet Take 1 tablet by mouth at bedtime.     Calcium Carb-Cholecalciferol 500-400 MG-UNIT TABS Take 1 tablet by mouth 2 (two) times daily.     cetirizine (ZYRTEC) 10 MG tablet Take 10 mg by mouth daily.      ferrous sulfate 325 (65 FE) MG tablet Take 325 mg by  mouth daily with breakfast.     FLUoxetine (PROZAC) 20 MG tablet Take 20 mg by mouth daily.     glimepiride (AMARYL) 4 MG tablet Take 4 mg by mouth 2 (two) times daily.     KLOR-CON M20 20 MEQ tablet TAKE 1 TABLET BY MOUTH ONCE A DAY 30 tablet 6   Lancets MISC Use 1 each 2 (two) times daily     levothyroxine (SYNTHROID) 200 MCG tablet TAKE 1 TABLET(200 MCG) BY MOUTH EVERY DAY 30 TO 60 MINUTES BEFORE BREAKFAST ON AN EMPTY STOMACH AND WITH A GLASS OF WATER     magnesium chloride (SLOW-MAG) 64 MG TBEC SR tablet Take 1 tablet by mouth 2 (two) times daily.     meloxicam (MOBIC) 15 MG tablet Take 15 mg by mouth as needed.     pioglitazone (ACTOS) 30 MG tablet Take 30 mg by mouth daily.      simvastatin (ZOCOR) 20 MG tablet Take 20 mg by mouth at bedtime.     solifenacin (VESICARE) 10 MG tablet Take 10 mg by mouth daily.     TRULICITY 1.5 MG/0.5ML SOPN INJECT 1.5 MG SUBCUTANEOUSLY EVERY 7 (SEVEN) DAYS     letrozole  (FEMARA ) 2.5 MG tablet Take 1 tablet (2.5 mg total) by mouth daily. 90 tablet 1   No current facility-administered medications for this visit.    Review of Systems   Constitutional:  Negative for appetite change, chills, fatigue and fever.  HENT:   Negative for hearing loss and voice change.   Eyes:  Negative for eye problems.  Respiratory:  Negative for chest tightness and cough.   Cardiovascular:  Negative for chest pain.  Gastrointestinal:  Negative for abdominal distention, abdominal pain and blood in stool.  Endocrine: Negative for hot flashes.  Genitourinary:  Negative for difficulty urinating and frequency.   Musculoskeletal:  Positive for arthralgias.  Skin:  Negative for itching and rash.  Neurological:  Negative for extremity weakness.  Hematological:  Negative for adenopathy.  Psychiatric/Behavioral:  Negative for confusion.     Performance status (ECOG): 0 Today's Vitals   09/17/23 1356  BP: 122/78  Pulse: 92  Resp: 18  Temp: 97.7 F (36.5 C)  TempSrc: Tympanic  SpO2: 100%  Weight: 207 lb (93.9 kg)  PainSc: 0-No pain   Body mass index is 33.41 kg/m.  Physical Exam Constitutional:      General: She is not in acute distress.    Appearance: She is not diaphoretic.  HENT:     Head: Normocephalic and atraumatic.  Eyes:     General: No scleral icterus. Cardiovascular:     Rate and Rhythm: Normal rate and regular rhythm.  Pulmonary:     Effort: Pulmonary effort is normal. No respiratory distress.  Abdominal:     General: There is no distension.     Palpations: Abdomen is soft.     Tenderness: There is no abdominal tenderness.  Musculoskeletal:        General: Normal range of motion.     Cervical back: Normal range of motion and neck supple.  Skin:    General: Skin is warm and dry.  Neurological:     Mental Status: She is alert and oriented to person, place, and time.     Cranial Nerves: No cranial nerve deficit.     Motor: No abnormal muscle tone.     Coordination: Coordination normal.  Psychiatric:        Mood and Affect: Mood and affect normal.  Labs.    Latest Ref Rng & Units 09/17/2023    1:28 PM  03/19/2023    1:31 PM 08/14/2022    1:36 PM  CBC  WBC 4.0 - 10.5 K/uL 8.2  7.5  6.3   Hemoglobin 12.0 - 15.0 g/dL 86.3  87.2  88.1   Hematocrit 36.0 - 46.0 % 42.3  39.0  36.8   Platelets 150 - 400 K/uL 338  345  279       Latest Ref Rng & Units 09/17/2023    1:28 PM 03/19/2023    1:31 PM 08/14/2022    1:36 PM  CMP  Glucose 70 - 99 mg/dL 681  816  754   BUN 8 - 23 mg/dL 15  16  16    Creatinine 0.44 - 1.00 mg/dL 8.99  9.12  9.15   Sodium 135 - 145 mmol/L 130  130  133   Potassium 3.5 - 5.1 mmol/L 4.2  4.0  3.7   Chloride 98 - 111 mmol/L 95  99  100   CO2 22 - 32 mmol/L 24  23  25    Calcium 8.9 - 10.3 mg/dL 8.9  8.7  8.7   Total Protein 6.5 - 8.1 g/dL 7.9  7.2  6.9   Total Bilirubin 0.0 - 1.2 mg/dL 0.7  0.7  0.4   Alkaline Phos 38 - 126 U/L 91  95  77   AST 15 - 41 U/L 41  27  22   ALT 0 - 44 U/L 37  29  23    RADIOGRAPHIC STUDIES: I have personally reviewed the radiological images as listed and agreed with the findings in the report. No results found.

## 2024-01-13 ENCOUNTER — Telehealth: Payer: Self-pay | Admitting: *Deleted

## 2024-01-13 NOTE — Telephone Encounter (Signed)
 Patient called and she will bring by a re certification form for her handicap placard.

## 2024-01-23 NOTE — Telephone Encounter (Signed)
 Handicap placard form received. Per Dr. Babara, she is unable to sign as the diagnosis alone does not meet the criteria for the placard. It is recommended for pt to reach out to PCP to see if they can complete this for her. Pt has been notified of this and form has been mailed back to pt.

## 2024-03-02 ENCOUNTER — Encounter

## 2024-04-05 ENCOUNTER — Other Ambulatory Visit

## 2024-04-12 ENCOUNTER — Ambulatory Visit

## 2024-04-12 ENCOUNTER — Ambulatory Visit: Admitting: Oncology

## 2024-04-12 ENCOUNTER — Other Ambulatory Visit
